# Patient Record
Sex: Female | Born: 1935
Health system: Southern US, Community
[De-identification: ages and names within clinical notes are randomized; demographics above are authoritative.]

## PROBLEM LIST (undated history)

## (undated) DIAGNOSIS — N83209 Unspecified ovarian cyst, unspecified side: Secondary | ICD-10-CM

## (undated) DIAGNOSIS — C4490 Unspecified malignant neoplasm of skin, unspecified: Secondary | ICD-10-CM

## (undated) DIAGNOSIS — R12 Heartburn: Secondary | ICD-10-CM

## (undated) DIAGNOSIS — I2510 Atherosclerotic heart disease of native coronary artery without angina pectoris: Secondary | ICD-10-CM

## (undated) DIAGNOSIS — F419 Anxiety disorder, unspecified: Secondary | ICD-10-CM

## (undated) DIAGNOSIS — I10 Essential (primary) hypertension: Secondary | ICD-10-CM

## (undated) DIAGNOSIS — E785 Hyperlipidemia, unspecified: Secondary | ICD-10-CM

## (undated) DIAGNOSIS — I4891 Unspecified atrial fibrillation: Secondary | ICD-10-CM

## (undated) DIAGNOSIS — M1990 Unspecified osteoarthritis, unspecified site: Secondary | ICD-10-CM

## (undated) HISTORY — PX: NEPHRECTOMY: SHX65

## (undated) HISTORY — DX: Essential (primary) hypertension: I10

## (undated) HISTORY — DX: Hyperlipidemia, unspecified: E78.5

## (undated) HISTORY — DX: Atherosclerotic heart disease of native coronary artery without angina pectoris: I25.10

## (undated) HISTORY — DX: Unspecified ovarian cyst, unspecified side: N83.209

## (undated) HISTORY — DX: Unspecified atrial fibrillation: I48.91

## (undated) HISTORY — PX: TUBAL LIGATION: SHX77

## (undated) HISTORY — PX: SKIN CANCER EXCISION: SHX779

## (undated) HISTORY — DX: Anxiety disorder, unspecified: F41.9

## (undated) HISTORY — DX: Unspecified osteoarthritis, unspecified site: M19.90

## (undated) HISTORY — PX: CHOLECYSTECTOMY: SHX55

## (undated) HISTORY — DX: Heartburn: R12

## (undated) HISTORY — PX: TONSILLECTOMY: SUR1361

## (undated) HISTORY — DX: Unspecified malignant neoplasm of skin, unspecified: C44.90

---

## 1984-03-03 HISTORY — PX: CORONARY ARTERY BYPASS GRAFT: SHX141

## 1998-03-06 ENCOUNTER — Encounter: Payer: Self-pay | Admitting: Emergency Medicine

## 1998-03-06 ENCOUNTER — Inpatient Hospital Stay (HOSPITAL_COMMUNITY): Admission: EM | Admit: 1998-03-06 | Discharge: 1998-03-09 | Payer: Self-pay | Admitting: Emergency Medicine

## 1998-05-15 ENCOUNTER — Emergency Department (HOSPITAL_COMMUNITY): Admission: EM | Admit: 1998-05-15 | Discharge: 1998-05-15 | Payer: Self-pay | Admitting: *Deleted

## 1998-05-15 ENCOUNTER — Encounter: Payer: Self-pay | Admitting: *Deleted

## 1999-12-09 ENCOUNTER — Encounter: Admission: RE | Admit: 1999-12-09 | Discharge: 1999-12-09 | Payer: Self-pay | Admitting: Internal Medicine

## 1999-12-09 ENCOUNTER — Encounter (HOSPITAL_BASED_OUTPATIENT_CLINIC_OR_DEPARTMENT_OTHER): Payer: Self-pay | Admitting: Internal Medicine

## 2002-09-29 ENCOUNTER — Encounter: Admission: RE | Admit: 2002-09-29 | Discharge: 2002-09-29 | Payer: Self-pay | Admitting: Internal Medicine

## 2002-09-29 ENCOUNTER — Encounter (HOSPITAL_BASED_OUTPATIENT_CLINIC_OR_DEPARTMENT_OTHER): Payer: Self-pay | Admitting: Internal Medicine

## 2002-10-02 HISTORY — PX: HYSTEROSCOPY: SHX211

## 2002-11-01 ENCOUNTER — Ambulatory Visit (HOSPITAL_COMMUNITY): Admission: RE | Admit: 2002-11-01 | Discharge: 2002-11-01 | Payer: Self-pay | Admitting: Obstetrics and Gynecology

## 2002-11-01 ENCOUNTER — Encounter (INDEPENDENT_AMBULATORY_CARE_PROVIDER_SITE_OTHER): Payer: Self-pay | Admitting: Specialist

## 2003-04-10 ENCOUNTER — Encounter: Admission: RE | Admit: 2003-04-10 | Discharge: 2003-04-10 | Payer: Self-pay | Admitting: Internal Medicine

## 2004-06-24 ENCOUNTER — Other Ambulatory Visit: Admission: RE | Admit: 2004-06-24 | Discharge: 2004-06-24 | Payer: Self-pay | Admitting: *Deleted

## 2004-07-16 ENCOUNTER — Encounter: Admission: RE | Admit: 2004-07-16 | Discharge: 2004-07-16 | Payer: Self-pay | Admitting: *Deleted

## 2005-01-20 ENCOUNTER — Encounter: Admission: RE | Admit: 2005-01-20 | Discharge: 2005-01-20 | Payer: Self-pay | Admitting: Internal Medicine

## 2005-02-25 ENCOUNTER — Encounter: Admission: RE | Admit: 2005-02-25 | Discharge: 2005-02-25 | Payer: Self-pay | Admitting: Internal Medicine

## 2005-03-11 ENCOUNTER — Encounter: Admission: RE | Admit: 2005-03-11 | Discharge: 2005-03-11 | Payer: Self-pay | Admitting: Internal Medicine

## 2005-11-20 ENCOUNTER — Encounter: Admission: RE | Admit: 2005-11-20 | Discharge: 2005-11-20 | Payer: Self-pay | Admitting: Internal Medicine

## 2006-01-29 ENCOUNTER — Inpatient Hospital Stay (HOSPITAL_COMMUNITY): Admission: AD | Admit: 2006-01-29 | Discharge: 2006-02-02 | Payer: Self-pay | Admitting: Internal Medicine

## 2006-02-19 ENCOUNTER — Encounter (HOSPITAL_COMMUNITY): Admission: RE | Admit: 2006-02-19 | Discharge: 2006-05-20 | Payer: Self-pay | Admitting: Cardiology

## 2006-05-21 ENCOUNTER — Encounter (HOSPITAL_COMMUNITY): Admission: RE | Admit: 2006-05-21 | Discharge: 2006-05-29 | Payer: Self-pay | Admitting: Cardiology

## 2006-06-23 ENCOUNTER — Encounter: Admission: RE | Admit: 2006-06-23 | Discharge: 2006-06-23 | Payer: Self-pay | Admitting: Internal Medicine

## 2006-11-12 ENCOUNTER — Other Ambulatory Visit: Admission: RE | Admit: 2006-11-12 | Discharge: 2006-11-12 | Payer: Self-pay | Admitting: Obstetrics & Gynecology

## 2006-12-08 ENCOUNTER — Encounter: Admission: RE | Admit: 2006-12-08 | Discharge: 2006-12-08 | Payer: Self-pay | Admitting: Obstetrics & Gynecology

## 2006-12-08 LAB — HM MAMMOGRAPHY: HM Mammogram: NORMAL

## 2007-02-24 ENCOUNTER — Encounter: Admission: RE | Admit: 2007-02-24 | Discharge: 2007-02-24 | Payer: Self-pay | Admitting: Internal Medicine

## 2007-04-15 ENCOUNTER — Inpatient Hospital Stay (HOSPITAL_COMMUNITY): Admission: EM | Admit: 2007-04-15 | Discharge: 2007-04-18 | Payer: Self-pay | Admitting: Emergency Medicine

## 2007-04-16 HISTORY — PX: CARDIAC CATHETERIZATION: SHX172

## 2007-07-08 ENCOUNTER — Encounter: Admission: RE | Admit: 2007-07-08 | Discharge: 2007-07-08 | Payer: Self-pay | Admitting: Internal Medicine

## 2007-10-21 ENCOUNTER — Encounter: Admission: RE | Admit: 2007-10-21 | Discharge: 2007-10-21 | Payer: Self-pay | Admitting: Internal Medicine

## 2007-11-15 ENCOUNTER — Other Ambulatory Visit: Admission: RE | Admit: 2007-11-15 | Discharge: 2007-11-15 | Payer: Self-pay | Admitting: Obstetrics and Gynecology

## 2007-11-15 LAB — HM PAP SMEAR

## 2008-01-06 ENCOUNTER — Encounter: Admission: RE | Admit: 2008-01-06 | Discharge: 2008-01-06 | Payer: Self-pay | Admitting: Internal Medicine

## 2008-03-31 ENCOUNTER — Encounter: Admission: RE | Admit: 2008-03-31 | Discharge: 2008-03-31 | Payer: Self-pay | Admitting: Internal Medicine

## 2008-07-26 ENCOUNTER — Encounter: Admission: RE | Admit: 2008-07-26 | Discharge: 2008-07-26 | Payer: Self-pay | Admitting: Internal Medicine

## 2009-01-05 ENCOUNTER — Encounter: Admission: RE | Admit: 2009-01-05 | Discharge: 2009-01-05 | Payer: Self-pay | Admitting: Internal Medicine

## 2009-05-11 ENCOUNTER — Encounter: Admission: RE | Admit: 2009-05-11 | Discharge: 2009-05-11 | Payer: Self-pay | Admitting: Internal Medicine

## 2009-07-04 ENCOUNTER — Encounter: Admission: RE | Admit: 2009-07-04 | Discharge: 2009-07-04 | Payer: Self-pay | Admitting: Internal Medicine

## 2009-07-16 ENCOUNTER — Encounter: Admission: RE | Admit: 2009-07-16 | Discharge: 2009-07-16 | Payer: Self-pay | Admitting: Internal Medicine

## 2009-11-15 ENCOUNTER — Ambulatory Visit: Payer: Self-pay | Admitting: Cardiology

## 2009-11-21 ENCOUNTER — Encounter: Admission: RE | Admit: 2009-11-21 | Discharge: 2009-11-21 | Payer: Self-pay | Admitting: Internal Medicine

## 2010-02-14 ENCOUNTER — Encounter
Admission: RE | Admit: 2010-02-14 | Discharge: 2010-02-14 | Payer: Self-pay | Source: Home / Self Care | Attending: Internal Medicine | Admitting: Internal Medicine

## 2010-02-28 ENCOUNTER — Encounter
Admission: RE | Admit: 2010-02-28 | Discharge: 2010-02-28 | Payer: Self-pay | Source: Home / Self Care | Attending: Internal Medicine | Admitting: Internal Medicine

## 2010-03-23 ENCOUNTER — Encounter (HOSPITAL_BASED_OUTPATIENT_CLINIC_OR_DEPARTMENT_OTHER): Payer: Self-pay | Admitting: Internal Medicine

## 2010-05-16 ENCOUNTER — Ambulatory Visit: Payer: Self-pay | Admitting: Cardiology

## 2010-06-06 ENCOUNTER — Ambulatory Visit: Payer: Self-pay | Admitting: Cardiology

## 2010-06-24 ENCOUNTER — Ambulatory Visit: Payer: Self-pay | Admitting: Cardiology

## 2010-06-26 ENCOUNTER — Other Ambulatory Visit (HOSPITAL_BASED_OUTPATIENT_CLINIC_OR_DEPARTMENT_OTHER): Payer: Self-pay | Admitting: Internal Medicine

## 2010-06-26 DIAGNOSIS — M549 Dorsalgia, unspecified: Secondary | ICD-10-CM

## 2010-07-01 ENCOUNTER — Other Ambulatory Visit: Payer: Self-pay | Admitting: *Deleted

## 2010-07-01 DIAGNOSIS — E7800 Pure hypercholesterolemia, unspecified: Secondary | ICD-10-CM

## 2010-07-02 ENCOUNTER — Ambulatory Visit: Payer: Self-pay | Admitting: Cardiology

## 2010-07-05 ENCOUNTER — Ambulatory Visit
Admission: RE | Admit: 2010-07-05 | Discharge: 2010-07-05 | Disposition: A | Discharge status: Home / Self Care | Payer: Medicare Other | Source: Ambulatory Visit | Attending: Internal Medicine | Admitting: Internal Medicine

## 2010-07-05 ENCOUNTER — Other Ambulatory Visit (HOSPITAL_BASED_OUTPATIENT_CLINIC_OR_DEPARTMENT_OTHER): Payer: Self-pay | Admitting: Internal Medicine

## 2010-07-05 DIAGNOSIS — M549 Dorsalgia, unspecified: Secondary | ICD-10-CM

## 2010-07-16 NOTE — H&P (Signed)
NAMELYLLA, EIFLER NO.:  [id number]   MEDICAL RECORD NO.:  [id number]          PATIENT TYPE:  INP   LOCATION:  2913                         FACILITY:  [facility]   PHYSICIAN:  Peter M. Swaziland, M.D.  DATE OF BIRTH:  15-Oct-1934   DATE OF ADMISSION:  04/15/2007  DATE OF DISCHARGE:                              HISTORY & PHYSICAL   HISTORY OF PRESENT ILLNESS:  Ms. Rawling is a pleasant 76 year old white  female who was seen as a work-in today for evaluation of chest pain.  She has a known history of coronary disease is status post coronary  bypass surgery x3 in 1986.  Her last evaluation in November 2007 when  she presented with unstable angina.  Cardiac catheterization at that  time demonstrated severe three-vessel obstructive coronary disease.  She  had a 40% narrowing in the distal left main coronary.  The LAD was  severely diffusely diseased up to 90% throughout the proximal and mid  vessel.  The first diagonal branch was relatively small and had a 90%  stenosis in the mid vessel.  There  was moderate-sized intermediate  vessel which had a 95% ostial stenosis, and there was an 80-90% stenosis  in the proximal intermediate.  The left circumflex coronary and 40-50%  disease in the mid vessel.  The right coronary was dominant, had a 60-  70% stenosis in the proximal and mid vessel and a 70% stenosis in the  PDA.  The saphenous vein graft to the posterolateral branch was  occluded.  Saphenous vein graft to the intermediate branch was occluded.  The LIMA graft to the LAD was patent with good runoff.  Left ventricular  function was normal with ejection fraction of 60%.  There were no  targets that were amenable to catheter-based intervention at that time.  The patient was seen consultation by Dr. Andrey Spearman, who felt  that the left coronary graft targets were very poor and recommended  continued medical therapy.  The patient has been treated medically and  has done  quite well with occasional anginal symptoms.  However,  yesterday she began experiencing some mild substernal chest discomfort.  This morning when she got up to fix her breakfast, she developed more  severe chest pain.  If she would lie down and stretch out, the pain  would get better.  She came to our office this afternoon.  On walking  into the office, she developed severe substernal chest pain associated  with weakness and clamminess.  She was given three sublingual  nitroglycerin with improvement in her pain.  Her ECG shows a sinus  tachycardia with ST depression consistent with inferolateral ischemia.  Given her unstable angina, the patient is now admitted for aggressive  medical therapy and consideration of alternative treatments.   PAST MEDICAL HISTORY:  1. Hypertension.  2. Hyperlipidemia.  3. History of basal cell and squamous cell skin cancers.  4. History of anxiety.  5. History of lumbar degenerative disk disease.  6. Coronary artery disease as noted above.   She is allergic to SULFA and DEMEROL.   Additional surgeries  include prior left nephrectomy in 1963 for  hypertension.  She has had bilateral tubal ligations.  She has had  previous cholecystectomy.   CURRENT MEDICATIONS:  1. Zocor 20 mg per day.  2. Aspirin 325 mg daily.  3. Imdur 60 mg per day.  4. Prilosec 20 mg per day.  5. Metoprolol ER 100 mg daily.  6. Plavix 75 mg per day.  7. Norvasc 5 mg per day.  8. Benicar 40 mg per day.  9. Caltrate 600 mg daily.  10.Multivitamin daily.  11.Claritin 10 mg daily.  12.Prempro 45/65 mg daily.   SOCIAL HISTORY:  The patient is married.  She has worked for [employer]  [place].  She denies a history of tobacco or alcohol use.  She has one  son.   FAMILY HISTORY:  Father died at age 63 of myocardial infarction.  Mother  died in her 53s with Alzheimer disease.  One brother has had coronary  artery bypass surgery in his 76s.  One sister has breast cancer.  Other   family history is negative.   REVIEW OF SYSTEMS:  The patient denies any recent weight gain, edema,  orthopnea or PND.  She has no history of TIA or stroke.  She does have  claustrophobia.  She has had no recent change in bowel or bladder  habits.  Other review of systems is negative.   PHYSICAL EXAMINATION:  The patient is a pleasant white female who  appears in distress and clammy.  Her blood pressure is 142/90, pulse is 120 and regular.  HEENT:  She is normocephalic, atraumatic.  She wears glasses.  Pupils  are equal, round, reactive to light accommodation.  Extraocular  movements are full.  Oropharynx is clear.  NECK:  Supple without JVD, adenopathy, thyromegaly or bruits.  LUNGS:  Clear to auscultation and percussion.  CARDIAC:  Reveals regular rate and rhythm without gallop, murmur, rub or  click.  ABDOMEN:  Soft, nontender, without masses or hepatosplenomegaly.  She  has old surgical scars.  Femoral and pedal pulses 2+ and symmetric.  She has no edema.  NEUROLOGIC:  Nonfocal.   LABORATORY DATA:  ECG shows sinus tachycardia with ST-T wave abnormality  consistent with inferolateral ischemia.   IMPRESSION:  1. Unstable angina pectoris.  2. Coronary artery disease, status post coronary artery bypass      grafting in 1986 with documented occlusion of vein grafts to the      posterolateral branch and to the intermediate branch.  3. Hypertension.  4. Hypercholesterolemia.   PLAN:  Proceed with emergent admission.  Will stabilize with IV  nitroglycerin, IV Lopressor, subcu Lovenox and IV Integrilin.  We will  obtain serial cardiac enzymes to rule out myocardial infarction.  We  will plan cardiac catheterization tomorrow.           ______________________________  Peter M. Swaziland, M.D.     PMJ/MEDQ  D:  04/15/2007  T:  04/17/2007  Job:  [id number]   cc:   Barry Dienes. Eloise Harman, M.D.

## 2010-07-16 NOTE — Cardiovascular Report (Signed)
NAMECIERA, Marilyn NO.:  [id number]   MEDICAL RECORD NO.:  [id number]          PATIENT TYPE:  INP   LOCATION:  2913                         FACILITY:  [facility]   PHYSICIAN:  Peter M. Swaziland, M.D.  DATE OF BIRTH:  10-09-1934   DATE OF PROCEDURE:  04/16/2007  DATE OF DISCHARGE:                            CARDIAC CATHETERIZATION   INDICATIONS FOR PROCEDURE:  A 76 year old white female who presents with  unstable angina pectoris.  She has known history of coronary disease and  is status post coronary bypass surgery in 1986.  Cardiac catheterization  November 2007 documented occlusion of the vein grafts to the right  coronary artery and to the intermediate branch.  She was considered for  surgery at that time and felt to be a poor candidate due to poor targets  and due to the fact that her right coronary did not have severe disease.   PROCEDURE:  Left heart catheterization, left ventricular angiography,  saphenous vein graft angiography and LIMA graft angiography.   ACCESS:  Via right femoral artery using the standard Seldinger  technique.   EQUIPMENT:  A 6-French 4-cm right and left Judkins catheter, 6-French  pigtail catheter, 6-French arterial sheath.   MEDICATIONS:  Local anesthesia with 1% Xylocaine, Versed 2 mg IV.   CONTRAST:  100 mL of Omnipaque.   HEMODYNAMIC DATA:  Aortic pressure is 156/65 with a mean of 103.  Left  ventricle pressure is 148 with an EDP of 19.   ANGIOGRAPHIC DATA:  The left coronary arises and distributes normally.  The left main coronary is without significant disease.   Left anterior descending artery has 90% segmental stenosis in the  proximal vessel.  It is occluded in the midvessel after the first  diagonal.  The first diagonal branch has a 90% stenosis proximally and  then again in the midvessel.   There is a moderate-sized intermediate vessel which has a 95% ostial  stenosis.  This is followed by 80-90% segmental stenosis  in the proximal  vessel.  There is some retrograde filling of an old vein graft stump.   Left circumflex coronary is a small vessel, continues in the AV groove.  It has 40% disease in the midvessel.   The right coronary is a large dominant vessel.  It is diffusely and  heavily calcified.  He has a 60% eccentric stenosis in the proximal  vessel with diffuse disease in the proximal to mid vessel.  The PDA has  an 80-90% stenosis proximally.  The first posterolateral branch is  without significant disease.  The continuation of the posterolateral  branch is occluded.   Saphenous vein graft to the posterolateral branch of the right coronary  is occluded.   We did not cannulate this vein graft to the intermediate branch, but  this was noted to be occluded from prior cardiac catheterizations, and  the occluded stump can be seen filling retrograde.   The LIMA graft is widely patent with excellent runoff.   Left ventricular angiography was performed in the RAO view.  This  demonstrates normal left ventricular size and contractility with  normal  systolic function.  Ejection fraction is estimated at 60%.  There is  mild to moderate mitral insufficiency.   IMPRESSION:  1. Severe three-vessel obstructive coronary artery disease.  2. Occluded saphenous vein graft to the intermediate vessel.  3. Occluded saphenous vein graft to the posterolateral branch of the      right coronary.  4. Patent left internal mammary artery graft to left anterior      descending artery.  5. Normal left ventricular function.   PLAN:  Compared to her prior study in November 2007, there has been no  significant change, and I would recommend continued aggressive medical  therapy and consider a workup of other causes of her chest pain.           ______________________________  Peter M. Swaziland, M.D.     PMJ/MEDQ  D:  04/16/2007  T:  04/19/2007  Job:  [id number]   cc:   Barry Dienes. Eloise Harman, M.D.

## 2010-07-16 NOTE — Discharge Summary (Signed)
Marilyn, Ellis NO.:  [id number]   MEDICAL RECORD NO.:  [id number]          PATIENT TYPE:  INP   LOCATION:  2010                         FACILITY:  [facility]   PHYSICIAN:  Peter M. Swaziland, M.D.  DATE OF BIRTH:  04-Sep-1934   DATE OF ADMISSION:  04/15/2007  DATE OF DISCHARGE:  04/18/2007                               DISCHARGE SUMMARY   HISTORY OF PRESENT ILLNESS:  [name] is a 76 year old white female  with known history of coronary artery disease.  She is status post CABG  x3 in 1986.  Last cardiac catheterization November 2007 showed occlusion  of vein graft to the posterolateral branch of the right coronary, and  occlusion of the vein graft to the intermediate branch.  The right  coronary otherwise had no significant obstructive disease.  The patient  was evaluated by cardiothoracic surgery at that time, and felt not to be  a good candidate for redo bypass surgery, since the targets of her left  coronary were poor.  She has done well on medical therapy since that  time, until she presented at this time with acute, severe substernal  chest pain radiating up into her jaw.  She was noted to be tachycardic.  An ECG showed somewhat diffuse ST depression, consistent with ischemia.  She was admitted for further evaluation.  For details of her past  medical history, social history, family history and physical exam,  please see admission history and physical.   LABORATORY DATA:  Initial ECG shows sinus tachycardia with rate of 121.  No ST or T-wave changes consistent with inferolateral ischemia.  Chest X-  Ray:  Showed no active disease.  Hemoglobin 14.5, hematocrit 41.3, white  count 10,200, platelets 171,000.  Sodium 137, potassium 3.6, chloride  101, CO2 25, BUN 8, creatinine 0.7, glucose of 132.  Coags were normal.  Liver function studies were normal.  Serial cardiac enzymes were normal.  Cholesterol 165, LDL 91, HDL 31, triglycerides 213.  Electrolytes were  normal.  Glycosylated hemoglobin was 5.6%.   HOSPITAL COURSE:  The patient was admitted to [facility].  She was begun on  subcutaneous Lovenox, IV heparin and IV nitroglycerin.  She was also  loaded with IV Lopressor.  With this therapy, her chest pain resolved.  Her heart rate came down.  Her blood pressure remained stable.  Her ECG  returned to normal.  As noted, she ruled out myocardial infarction and  her blood pressure remained stable.  She was started on Niaspan 500 mg  q.h.s. for her dyslipidemia, in addition to her Zocor.   On April 16, 2007 she underwent a cardiac catheterization.  This  demonstrated a 90% proximal LAD stenosis, followed by total occlusion  after the first diagonal branch.  The first diagonal branch had a 90%  stenosis proximally and 95% stenosis in the midvessel.  The intermediate  branch had a 95% ostial stenosis, followed by 80-90% stenosis  proximally.  The left circumflex coronary was small caliber and had  nonobstructive disease up to 40%.  The right coronary was heavily  calcified throughout.  There was  50-60% disease proximally. and 80-90%  stenosis in the PDA.  The vein graft to the posterolateral branch of the  right coronary remained occluded, as did the vein graft to the  intermediate.  The LIMA graft to the LAD was widely patent with good  runoff.  Left ventricular function was normal, with ejection fraction of  60%.  There was mild to moderate mitral insufficiency and mild aortic  root enlargement.  This was compared with previous catheterization in  November 2007.  There was no significant change.  Therefore, the cause  of her acute chest pain was really unclear.  We stopped her IV  nitroglycerin at this point.  She was started back on oral nitrates.  Her Integrilin and Lovenox were held.  Her oxygen was discontinued and  she was transferred to telemetry and progressively ambulated.  On  April 17, 2007 she did undergo a CT of her chest with  contrast, which  showed no evidence of pulmonary embolus or aortic dissection.  Her  hemoglobin remained stable.  Her electrolytes remained stable.  She was  discharged home in stable condition on April 18, 2007.   DISCHARGE DIAGNOSES:  1. Unstable angina pectoris.  2. Coronary artery disease, status post coronary artery bypass      grafting; with known occlusion of the vein grafts to the      intermediate vessel, and to the posterolateral branch of the right      coronary artery.  3. Dyslipidemia, combined.  4. Hypertension.   DISCHARGE MEDICATIONS:  1. Aspirin 325 mg per day.  2. Prempro 0.45/1.5 mg daily.  3. Zocor 20 mg per day.  4. Toprol XL 100 mg per day.  5. Plavix 75 mg daily.  6. Norvasc 5 mg daily.  7. Benicar 40 mg per day.  8. Caltrate 600 plus D daily.  9. Centrum Silver multivitamin daily.  10.Claritin daily.  11.Prilosec 20 mg per day.  12.Isosorbide 60 mg per day.  13.Niaspan 500 mg at bedtime.  14.Nitroglycerin sublingual p.r.n.   The patient will remain on a heart-healthy diet.  She will gradually  increase her activity.  She will follow up with Dr. Swaziland in 2 weeks.   DISCHARGE STATUS:  Improved.           ______________________________  Peter M. Swaziland, M.D.     PMJ/MEDQ  D:  04/18/2007  T:  04/19/2007  Job:  [id number]   cc:   Barry Dienes. Eloise Harman, M.D.

## 2010-07-18 ENCOUNTER — Encounter: Payer: Self-pay | Admitting: Cardiology

## 2010-07-18 DIAGNOSIS — M1990 Unspecified osteoarthritis, unspecified site: Secondary | ICD-10-CM | POA: Insufficient documentation

## 2010-07-18 DIAGNOSIS — C4490 Unspecified malignant neoplasm of skin, unspecified: Secondary | ICD-10-CM | POA: Insufficient documentation

## 2010-07-18 DIAGNOSIS — E785 Hyperlipidemia, unspecified: Secondary | ICD-10-CM | POA: Insufficient documentation

## 2010-07-18 DIAGNOSIS — F419 Anxiety disorder, unspecified: Secondary | ICD-10-CM | POA: Insufficient documentation

## 2010-07-18 DIAGNOSIS — I10 Essential (primary) hypertension: Secondary | ICD-10-CM | POA: Insufficient documentation

## 2010-07-18 DIAGNOSIS — R12 Heartburn: Secondary | ICD-10-CM | POA: Insufficient documentation

## 2010-07-18 DIAGNOSIS — I2510 Atherosclerotic heart disease of native coronary artery without angina pectoris: Secondary | ICD-10-CM | POA: Insufficient documentation

## 2010-07-18 DIAGNOSIS — H9190 Unspecified hearing loss, unspecified ear: Secondary | ICD-10-CM | POA: Insufficient documentation

## 2010-07-19 ENCOUNTER — Other Ambulatory Visit: Payer: Self-pay | Admitting: *Deleted

## 2010-07-19 ENCOUNTER — Ambulatory Visit: Payer: Self-pay | Admitting: Cardiology

## 2010-07-19 ENCOUNTER — Encounter: Payer: Self-pay | Admitting: Cardiology

## 2010-07-19 NOTE — Consult Note (Signed)
NAMETAMSIN, NADER NO.:  [id number]   MEDICAL RECORD NO.:  [id number]          PATIENT TYPE:  INP   LOCATION:  3715                         FACILITY:  [facility]   PHYSICIAN:  Georga Hacking, M.D.DATE OF BIRTH:  Sep 05, 1934   DATE OF CONSULTATION:  01/29/2006  DATE OF DISCHARGE:                                 CONSULTATION   I was asked to see this very nice 76 year old by Dr. Eloise Harman for  evaluation of chest pain and unstable angina.  The patient has a history  of coronary artery disease and has a previous history of bypass grafting  reportedly x3 in 1986.  The grafts are unknown, but according to  records, she had a follow up catheterization in 2000 that showed a  patent internal mammary graft to the LAD with occlusion of the vein  graft to the first diagonal branch.  Another graft was not mentioned.  She has had a previous partially reversible bilateral defect noted in  the past thought due to the diagonal disease.  She has had chronic  angina which has been worse with breathing cold air in for several years  but had been stable.  She was on vacation and while away, two nights  while watching television, she developed severe mid sternal chest  discomfort of moderate intensity with radiation to the neck and the left  arm lasting for several hours resolving with going to sleep.  She had  some mild shaking associated with this.  She did not have nitroglycerin  tablets with her and returned to [facility] today where she saw Dr.  Eloise Harman.  An EKG was normal and she was admitted to the hospital to  rule out a myocardial infarction.  She subsequently had an abnormal  cardiac MB of 8.8 and a positive troponin of greater than 1.  She is not  currently having any chest discomfort.   PAST MEDICAL HISTORY:  Remarkable for hypertension, hyperlipidemia,  previous history of basal cell and squamous cell carcinomas, anxiety,  mild claustrophobia, and a previous history of  lumbar spine disease.   PAST SURGICAL HISTORY:  She has had a previous cholecystectomy, left  nephrectomy due to hypertension in 1963, remote bilateral tubal  ligation, coronary artery bypass grafting x3 reportedly, and previous  cataract surgery.  She evidently had an appendectomy at the time of her  gallbladder surgery.   ALLERGIES:  DEMEROL AND SULFA.   MEDICATIONS:  Recorded by Dr. Eloise Harman include Toprol XL 100 mg daily,  Prilosec 20 mg daily, Claritin D, Benicar 40 mg 1/2 tablet daily,  Prempro 2.5/0.625 mg daily, Norvasc 5 mg daily, Ecotrin 325 mg daily,  Zocor 40 mg daily, Niacin 500 mg b.i.d.   SOCIAL HISTORY:  She has one son.  She has been married for 40 years and  has previously worked at [employer] [place].  She has no history of smoking  or alcohol abuse and is happily married.   FAMILY HISTORY:  As recorded in Dr. Silvano Rusk note is reviewed and I  have nothing to add to it.   REVIEW OF SYMPTOMS:  She  has had a 6 pound weight gain since September.  She has not had recent fever or chills.  She has a history of basal cell  and squamous cell carcinomas.  She has had a previous history of  cataract extraction.  She has no glaucoma or macular degeneration.  She  has had a history of hiatal hernia and episodic dyspepsia.  She also  complains of occasional difficulty swallowing.  She has had some anxiety  previously and some mild claustrophobia.  She has mild headaches in the  morning.  She has a previous history of significant lumbar disc disease  with previous epidural spinal injections.  Other than as noted above,  the remainder of the review of systems is unremarkable.   PHYSICAL EXAMINATION:  GENERAL:  She is a pleasant, mildly anxious female appearing older than  her stated age of 40.  VITAL SIGNS:  Blood pressure 151/86, pulse was 78.  SKIN:  Warm and dry.  There is a large hemangioma across the back that  is congenital.  HEENT:  EOMI, PERRLA, CNS clear, fundi not  examined.  Pharynx negative.  NECK:  Supple without masses, JVD, thyromegaly, or bruits.  LUNGS:  Clear to A&P.  CARDIOVASCULAR:  Normal S1 and S2, no S3 or murmur.  ABDOMEN:  Soft and nontender.  EXTREMITIES:  Femoral pulses are 2+.  There is a well healed saphenous  harvest scar running the length of the right leg.  NEUROLOGICAL:  Normal.   EKG is normal.   LABORATORY DATA:  Normal white count, hemoglobin 13.4, hematocrit 38,  MCV 101, platelet count 115,000 which is slightly low.  Chemistry panel  shows a sodium 138, potassium 3.5, glucose 121.  SGOT 50.  Her CPK total  is 416 with an MB of 8.8, troponin I 1.83 which is elevated.   IMPRESSION:  1. Chest discomfort consistent with acute coronary syndrome and      probable recent myocardial infarction as evidenced by increased      troponin.  2. Coronary artery disease with previous bypass grafting, occlusion of      the graft to the diagonal, unknown grafts elsewhere.  3. Hypertension.  4. Hyperlipidemia under treatment.  5. History of hiatal hernia.  6. Mild thrombocytopenia.  7. Elevation of glucoses with previous diagnosis of diabetes.   RECOMMENDATIONS:  She was placed on heparin and she can continue on  aspirin and beta blockers.  If she has recurrent symptoms, initiate  Plavix.  I believe that if she has recurrent symptoms, she should be  placed on a 2B3 inhibitor and likely will need a repeat catheterization.  We will keep her n.p.o. and ask for Dr. Swaziland to see her in the  morning.  We discussed catheterization with her fully and she is  agreeable to proceed.      Georga Hacking, M.D.  Electronically Signed     WST/MEDQ  D:  01/29/2006  T:  01/29/2006  Job:  [id number]   cc:   Peter M. Swaziland, M.D.  Barry Dienes Eloise Harman, M.D.

## 2010-07-19 NOTE — Op Note (Signed)
NAME:  [name], [name]                        ACCOUNT NO.:  [id number]   MEDICAL RECORD NO.:  [id number]                   PATIENT TYPE:  AMB   LOCATION:  SDC                                  FACILITY:  [facility]   PHYSICIAN:  Laqueta Linden, M.D.                 DATE OF BIRTH:  03/04/1934   DATE OF PROCEDURE:  11/01/2002  DATE OF DISCHARGE:                                 OPERATIVE REPORT   PREOPERATIVE DIAGNOSIS:  Postmenopausal bleeding, due to endometrial polyps.   POSTOPERATIVE DIAGNOSIS:  Postmenopausal bleeding due to endometrial polyps.   PROCEDURE:  Hysteroscopic resection.   SURGEON:  Laqueta Linden, M.D.   ANESTHESIA:  General LMA.   ESTIMATED BLOOD LOSS:  Negligible.   FLUIDS:  Sorbitol intake 20 cc.   COMPLICATIONS:  None.   INDICATIONS:  [name] is a 76 year old gravida 1, para 1 menopausal  female, who presented with postmenopausal bleeding.  Pelvic ultrasound with  sonohistogram confirmed the presence of two endometrial polyps, with an  otherwise thin atrophic-appearing endometrial cavity.  The patient is to  undergo outpatient hysteroscopic resection.  She has seen the informed  consent film, voiced her understanding and acceptance of all risks,  benefits, alternatives and complications, including but not limited to:  Anesthesia risks (which are increased due to her medical problems), risks of  infection or bleeding, risks of uterine perforation, risks of bowel injury  as well as recurrent polyps, excessive bleeding postoperatively.  She has  seen the informed consent film, as has her husband; they voiced their  understanding and acceptance of all risks and agree to proceed.   DESCRIPTION OF PROCEDURE:  The patient was taken to the operating room after  proper identification and consent was ascertained.  She was placed on the  operating room table in the supine position.  After a general LMA was  introduced, she was placed in the [place] stirrups and the  perineum and vagina  were prepped and draped in routine sterile fashion.  A transurethral Foley  was placed; this was removed at the conclusion of the procedure.  Bimanual  examination confirmed an anterior mobile uterus.   A speculum was placed in the vagina, and the anterior lip of the cervix  grasped with a single-toothed tenaculum.  The endometrial cavity sounded to  8 cm.  The internal os was gently dilated to a 33-Pratt dilator.  The  resectoscope with single loop was then inserted under direct vision, with  continuous Sorbitol infusion.  The endocervical canal was free of lesions.  There were three separate  polyps noted.  Both tubal ostia were visualized.  The remainder of the endometrial cavity was completely atrophic in  appearance, with no other lesions.  The three isolated polyps were resected  and all tissue pieces were removed.  Hemostasis was excellent.  The scope  was then withdrawn.   Net Sorbitol intake was  20 cc.  The estimated blood loss was negligible.  The tenaculum was removed and the tenaculum site was hemostatic.  The  patient was stable and extubated on transfer to the PACU.  She will be  observed and discharged per anesthesia protocol.  She is to follow up in the  office in two to three weeks time, or to call sooner for excessive pain,  fever, bleeding or other concerns.  She is to take Advil or Tylenol as  needed for any cramping, and continue her routine medications.  She is given  routine verbal and written discharge instructions.  She is to call the  office for any problems.                                               Laqueta Linden, M.D.    LKS/MEDQ  D:  11/01/2002  T:  11/01/2002  Job:  [id number]

## 2010-07-19 NOTE — Cardiovascular Report (Signed)
[name], [name] NO.:  [id number]   MEDICAL RECORD NO.:  [id number]          PATIENT TYPE:  INP   LOCATION:  3715                         FACILITY:  [facility]   PHYSICIAN:  [name], M.D.  DATE OF BIRTH:  07-06-1934   DATE OF PROCEDURE:  01/30/2006  DATE OF DISCHARGE:                            CARDIAC CATHETERIZATION   INDICATIONS FOR PROCEDURE:  76 year old white female presents with non-Q-  wave myocardial infarction.  She is status post coronary bypass surgery  x3 in 1987.  She had documented occlusion of the vein graft to the  intermediate vessel in 2000.   PROCEDURE:  Left heart catheterization, coronary and left ventricular  angiography, saphenous vein graft angiography, LIMA graft angiography.   ACCESS:  The right femoral artery using standard Seldinger technique.   EQUIPMENT:  6 French 4 cm right and left Judkins catheter, 6 French  pigtail catheter, 6 French arterial sheath   MEDICATIONS:  Local anesthesia 1% Xylocaine, Versed 2 mg IV.   CONTRAST:  120 mL of Omnipaque.   HEMODYNAMIC DATA:  Aortic pressure of 159/61 with a mean of 102.  Left  ventricular pressure is 160 with EDP of 26 mmHg.   ANGIOGRAPHIC DATA:  1. The left coronary arises and distributes normally.  2. The left main coronary has 40% narrowing in the distal vessel.  3. The left anterior descending artery is severely and diffusely      diseased up to 90% throughout the proximal and mid vessel.  The      distal vessel is supplied by the LIMA graft.  The first diagonal      branch has a 90% stenosis and the mid vessel is a relatively small      branch.  There is an intermediate vessel which was a moderate size      and has a 95% ostial stenosis.  There is an 80-90% segmental      disease in the proximal vessel.  The mid vessel demonstrates the      stump of an old vein graft.  4. Left circumflex coronary is a small vessel that continues in the AV      groove.  It has 40-50%  disease in the mid vessel.  5. The right coronary is a large dominant vessel.  It is diffusely      calcified.  It has 60-70% eccentric stenoses in the proximal and      mid vessel.  The PDA has a 70% stenosis at its origin.  The first      posterolateral branch is without significant disease.  The second      posterolateral branch is occluded.  6. The saphenous vein graft to the second posterolateral branch is      occluded.  7. The saphenous vein graft to the intermediate branch is occluded.  8. The LIMA graft to the LAD is patent with excellent runoff.  9. Left ventricular angiography was performed in RAO view.  This      demonstrates normal left ventricular size and contractility with      normal systolic function.  Ejection fraction is estimated 60%.  No      segmental wall motion abnormalities were seen.  There is no      significant mitral insufficiency.   FINAL INTERPRETATION:  1. Severe three vessel obstructive coronary disease.  2. Occluded saphenous vein graft to the intermediate vessel and the      saphenous vein graft to the second posterolateral branch.  3. Patent LIMA graft to LAD.  4. Good left ventricular function.   PLAN:  I would consider the patient for redo coronary bypass surgery  with the potential targets being the intermediate branch, PDA and  posterolateral branches of the right coronary, and possibly the small  diagonal branch.  The other alternative would be aggressive medical  therapy.  She is not a candidate for percutaneous intervention.           ______________________________  [name], M.D.     PMJ/MEDQ  D:  01/30/2006  T:  01/31/2006  Job:  [id number]   cc:   [name]. [name], M.D.  [name] [name], M.D.

## 2010-07-19 NOTE — H&P (Signed)
[name], [name]              ACCOUNT NO.:  [id number]   MEDICAL RECORD NO.:  [id number]          PATIENT TYPE:  INP   LOCATION:  3715                         FACILITY:  [facility]   PHYSICIAN:  Barry Dienes. Eloise Harman, M.D.DATE OF BIRTH:  [birth year]   DATE OF ADMISSION:  01/29/2006  DATE OF DISCHARGE:                              HISTORY & PHYSICAL   CHIEF COMPLAINT:  Chest pain.   HISTORY OF PRESENT ILLNESS:  Patient is a 76 year old white female who  presented to the office for evaluation of chest pain.  She had been  doing well, until the past two nights while she was away on vacation [facility].  Both times, while watching TV, she developed shaking  chills associated with mid-substernal chest pain of moderate intensity,  with radiation to both sides of her neck and the left arm.  This symptom  lasted for several hours at a time and resolved with going to sleep.  She also had mild shortness of breath.  Of note, she had taken her  Niacin, prior to her symptoms which is not a danger for her.  She did  not have her nitroglycerin tablets available.  She is somewhat anxious  about her symptoms and presented to our office upon return from [facility].  She has a history of coronary artery disease and is status post  coronary artery bypass graft x3 in 1986.  She had a followup cardiac  catheterization in 2000 that showed a patent LIMA to LAD, with occlusion  of the saphenous vein graft to the first diagonal branch.  She also had  a Cardiolite exercise test done in June 2006 that showed left  ventricular ejection fraction of 68% with fair exercise capacity and the  develop of chest pain, with minimal inferolateral ST segment depression.  Nuclear images showed a partially reversible lateral wall defect.  Subsequently, medications were adjusted.  She has been doing well until  this past weekend.   PAST MEDICAL HISTORY:  Otherwise significant for hypertension,  hyperlipidemia,  non-metastatic basal cell and squamous cell skin  cancers, mild forgetfulness, and mild anxiety and claustrophobia, lumbar  spine degenerative disk disease and had epidural injections at the left  L5/S1 level in September 2007.   MEDICATIONS:  Prior to admission:  1. Niacin 500 mg p.o. b.i.d., short-acting type.  2. Zocor 40 mg daily.  3. Ecotrin 325 mg daily.  4. Norvasc 5 mg daily.  5. Prem-pro 2.5/0.625 once daily.  6. Multivitamin.  7. Nitroglycerin 0.4 mg sublingual p.r.n. chest pain.  8. Benicar 40-mg tablets, one half tab p.o. daily.  9. Vitamin C once daily.  10.Calcium once daily.  11.Toprol XL 100 mg daily.  12.Prilosec 20 mg daily.  13.Claritin D p.r.n.   ALLERGIES:  SULFA DRUGS AND DEMEROL.   PAST SURGICAL HISTORY:  1. 1963 left nephrectomy due to hypertension.  2. Remote bilateral tubal ligation, 1990.  3. Cholecystectomy 1986.  4. Coronary artery bypass graft x3 including a LIMA graft to the LAD.  5. January 2000 cardiac catheterization.   FAMILY HISTORY:  Her father died at  the age of 53 of myocardial  infarction.  Her mother is in her 77s and has Alzheimer's dementia.  A  brother had coronary artery bypass graft surgery in his 48s.  A sister  has hyperlipidemia, hypertension and had breast cancer.  There are no  close family members with diabetes mellitus or colon cancer.   SOCIAL HISTORY:  She has been married for over 40 years.  She had been  working at [employer] [place].  She has no history of tobacco or alcohol  abuse.  She has one son in his 61s who has hyperlipidemia.   REVIEW OF SYSTEMS:  She has had a weight gain of 6 pounds since  November 12, 2005.  She has not had recent fever.  She has no change in  her vision and denies palpitations, productive cough, constipation,  diarrhea, rectal bleeding.  She has mild indigestion symptoms.  Her back  pain is no longer troublesome for her.  She has mild intermittent  anxiety and no depression.  She has  occasional mild headaches in the  morning and has mild to moderate caffeine consumption.   INITIAL PHYSICAL EXAMINATION:  VITAL SIGNS:  Blood pressure 130/88,  pulse 76, respirations 20, temperature 97.7, weight 154 pounds.  GENERAL:  She is a mildly overweight white female who is in no apparent  distress.  HEENT:  Within normal limits.  NECK:  Supple without jugular venous distention or carotid bruits.  CHEST:  Clear to auscultation.  HEART:  Regular rate and rhythm.  S1 and S2 are present without  significant murmur or gallop.  ABDOMEN:  Had normal bowel sounds and no hepatosplenomegaly or  tenderness.  EXTREMITIES:  Were without cyanosis, clubbing or edema, and the pedal  pulses were normal.  NEUROLOGICAL:  Nonfocal.   LABORATORY STUDIES:  EKG in the office today showed the following:  1. Normal sinus rhythm.  2. Nonspecific ST segment abnormalities with very minimal ST segment      depression in the lateral leads.  3. No significant change versus EKG from October 2002.   IMPRESSION/PLAN:  1. Chest pain:  Although her symptoms are somewhat atypical for      coronary artery disease, it would be most likely that she has      unstable angina, given her past medical history and family history      which is extremely positive for coronary disease.  Gastroesophageal      reflux disease seems less likely.  I plan to admit her to the      hospital for IV Heparin with initiation of long-acting nitrate      treatment.  We will have a cardiology consultant see her for the      possibility of repeat cardiac catheterization.  Serial and cardiac      isoenzymes will be drawn for the unlikely possibility of a non-Q-      wave myocardial infarction.  2. Hypertension.  Recently well controlled on her current medical      regimen.  3. Hyperlipidemia.  This has been stable on Niacin and Zocor. 4. We will repeat this fasting lipids as an outpatient, when she is on      her usual diet.  5.  Lumbar spine degenerative disk disease, stable on her current      regimen, having done well after cortisone injection in the lower      lumbar spine.  6. Anxiety:  Mild.  We will add Xanax p.r.n. to  her regimen during the      hospitalization.           ______________________________  Barry Dienes Eloise Harman, M.D.     DGP/MEDQ  D:  01/29/2006  T:  01/29/2006  Job:  [id number]   cc:   Peter M. [place], M.D.

## 2010-07-19 NOTE — Discharge Summary (Signed)
[name], TOUCHTON NO.:  [id number]   MEDICAL RECORD NO.:  [id number]          PATIENT TYPE:  INP   LOCATION:  3715                         FACILITY:  [facility]   PHYSICIAN:  Peter M. [place], M.D.  DATE OF BIRTH:  1935/01/18   DATE OF ADMISSION:  01/29/2006  DATE OF DISCHARGE:  02/02/2006                               DISCHARGE SUMMARY   HISTORY OF PRESENT ILLNESS:  [name] 76 year old white female who was  admitted for unstable angina.  The patient had been vacationing on the  [place] when she developed shaking chills.  She had mid sternal chest pain  of moderate intensity, radiating to both sides of her neck and left arm.  The symptoms lasted for several hours at a time.  She also had some  shortness of breath.  She did not have nitroglycerin at that time.  Because of these symptoms, she presented for further evaluation and,  given the nature of symptoms, she was admitted for further evaluation.  The patient is status post coronary bypass surgery x3 in 1986.  Her last  cardiac catheterization in 2000 showed a patent LIMA graft to the LAD  and patent saphenous vein graft to the second posterolateral branch.  The graft to her intermediate vessel was occluded.  She has been treated  medically since that time. She did have a Cardiolite study done in 2006  which showed a partially reversible lateral wall defect.  For details of  her Past Medical History, Social History, Family History and Physical  exam, please see admission History and Physical.   LABORATORY DATA:  Chest x-ray showed cardiomegaly with basilar scarring  with no active disease.   Her initial ECG showed normal sinus rhythm with no acute ST or T wave  changes.   Laboratory data: White count was 9600, hemoglobin 13.4, hematocrit 38.8,  platelets 115,000. Coags were normal.  Sodium 138, potassium 3.5,  chloride 104, CO2 26, BUN 5, creatinine 0.5, glucose of 121, albumin  3.0, AST 50. Other liver  function studies were normal.  CPK initially  was 416 with 8.8 MB; troponin was 1.83. From that point, her cardiac  enzymes decreased with CPK of 249 with a 5.7 MB and 178 with 4.8 MB.  Troponins drop to 1.53 and then 1.10.  Urinalysis was clear with  specific gravity of 1.006.  There was moderate leukocyte esterase  positivity; white cells and red cells were 3-6 per high-power field.  She had many bacteria.  Subsequent urine culture grew greater than 10-15  E. coli that was resistant to ampicillin and sulfa but since sensitive  to Cipro.   HOSPITAL COURSE:  The patient was admitted to telemetry monitoring.  She  was treated with IV heparin.  She subsequently ruled in for a small non-  Q-wave myocardial infarction.  Serial ECGs showed no acute change.  On  January 30, 2006, the patient underwent cardiac catheterization.  This  demonstrated 40% stenosis in the distal left main coronary artery.  The  left anterior descending artery had diffuse 90% disease in the proximal  to mid vessel.  The distal vessel was supplied by the LIMA graft.  There  was a small first diagonal which had a 90% stenosis in the mid vessel.  The intermediate vessel was moderate in length but very small in caliber  and had a 95% ostial stenosis and a 90% stenosis proximally.  The left  circumflex coronary artery was small and essentially just traversed in  the AV groove.  The right coronary artery was a dominant vessel and was  severely calcified.  There was 60-70% stenosis in the proximal and mid  vessel and 70% stenosis in the ostial PDA.  The second posterolateral  branch was occluded.  The saphenous vein graft to the intermediate was  again occluded. The saphenous vein graft to the second posterolateral  branch was occluded.  The LIMA graft to LAD was patent.  Left  ventricular function appeared normal with ejection fraction of 60%.   Based on her angiographic results, it was felt the patient was not a   candidate for percutaneous intervention.  It was felt that her non-Q-  wave MI was related to occlusion of the vein graft to the second  posterolateral branch.  She was seen in consultation by Dr. Andrey Spearman for consideration of bypass surgery.  However, he felt that  the vessels on the left coronary system were poor targets for grafting.  Since she only had moderate disease in the right coronary distribution,  it was felt that she would be best treated medically unless she had  refractory anginal symptoms.  As such, she was begun on Plavix.  She was  begun on nitrates in addition to her other medications.  She had no  significant anginal symptoms.  She was started on Cipro for a urinary  tract infection and was progressively ambulated.  She was discharged  home on February 02, 2006, in stable condition.   DISCHARGE DIAGNOSIS:  1. Non-Q-wave myocardial infarction.  2. Coronary disease status post coronary artery bypass grafting, now      with occlusion of both vein grafts.  She still has a patent left      internal mammary artery graft to the left anterior descending .  3. Urinary tract infection with Escherichia coli.  4. Hypertension.  5. Hyperlipidemia  6. Status post left nephrectomy.  7. Status post cholecystectomy.   DISCHARGE MEDICATIONS:  1. Aspirin 325 mg per day.  2. Plavix 75 mg daily.  3. Toprol XL 100 mg daily.  4. Imdur 30 mg per day.  5. Norvasc 5 mg daily.  6. Benicar 20 mg per day.  7. Prempro 2.5/0.0625 mg daily.  8. Nitroglycerin 0.4 mg sublingual p.r.n..  9. Prilosec 20 mg per day.  10.Cipro 250 mg twice a day for 7 days.  11.Multivitamin daily.  12.Zocor 40 mg per day.   The patient will follow up with Dr. Swaziland in 2 weeks.  She is to not  drive for 1 week.  She will remain on low-salt, low-fat diet.  She was  referred to phase 2 outpatient cardiac rehab.   DISCHARGE STATUS:  Improved.  ADDENDUM:  The patient did have significant headache  during her hospital  stay.  She underwent a CT of her head which showed chronic small vessel  changes but no acute changes.  It was felt that her headache was related  to nitrate therapy.           ______________________________  Peter M. [place], M.D.     PMJ/MEDQ  D:  02/02/2006  T:  02/02/2006  Job:  [id number]   cc:   Barry Dienes. Eloise Harman, M.D.  Salvatore Decent Dorris Fetch, M.D.

## 2010-08-20 ENCOUNTER — Other Ambulatory Visit: Payer: Self-pay | Admitting: Cardiology

## 2010-08-20 NOTE — Telephone Encounter (Signed)
Samples for renexa and benicar pulled. No plavix available.

## 2010-08-20 NOTE — Telephone Encounter (Signed)
Wants to know if we have any samples of renexa, benecar and plavx. She has an appointment tomorrow and would like to pick them up then if she can.

## 2010-08-21 ENCOUNTER — Other Ambulatory Visit (INDEPENDENT_AMBULATORY_CARE_PROVIDER_SITE_OTHER): Payer: Medicare Other | Admitting: *Deleted

## 2010-08-21 ENCOUNTER — Encounter: Payer: Self-pay | Admitting: Cardiology

## 2010-08-21 ENCOUNTER — Ambulatory Visit (INDEPENDENT_AMBULATORY_CARE_PROVIDER_SITE_OTHER): Payer: Medicare Other | Admitting: Cardiology

## 2010-08-21 DIAGNOSIS — E7800 Pure hypercholesterolemia, unspecified: Secondary | ICD-10-CM

## 2010-08-21 DIAGNOSIS — I10 Essential (primary) hypertension: Secondary | ICD-10-CM

## 2010-08-21 DIAGNOSIS — E785 Hyperlipidemia, unspecified: Secondary | ICD-10-CM

## 2010-08-21 DIAGNOSIS — I2510 Atherosclerotic heart disease of native coronary artery without angina pectoris: Secondary | ICD-10-CM

## 2010-08-21 LAB — BASIC METABOLIC PANEL
BUN: 17 mg/dL (ref 6–23)
CO2: 29 mEq/L (ref 19–32)
Calcium: 9.3 mg/dL (ref 8.4–10.5)
Creatinine, Ser: 0.6 mg/dL (ref 0.4–1.2)
GFR: 98.19 mL/min (ref >60.00)
Glucose, Bld: 107 mg/dL — ABNORMAL HIGH (ref 70–99)
Sodium: 139 mEq/L (ref 135–145)

## 2010-08-21 LAB — HEPATIC FUNCTION PANEL
Albumin: 3.9 g/dL (ref 3.5–5.2)
Alkaline Phosphatase: 60 U/L (ref 39–117)
Total Protein: 6.8 g/dL (ref 6.0–8.3)

## 2010-08-21 LAB — LIPID PANEL
HDL: 44.9 mg/dL (ref >39.00)
LDL Cholesterol: 98 mg/dL (ref 0–99)
Total CHOL/HDL Ratio: 4
VLDL: 31.4 mg/dL (ref 0.0–40.0)

## 2010-08-21 NOTE — Assessment & Plan Note (Signed)
Lipids are much improved since she has been taking her Crestor. We will continue on her current medical therapy.

## 2010-08-21 NOTE — Assessment & Plan Note (Signed)
She has stable anginal symptoms. She has poor targets and is not a good candidate for reoperation. We will continue with her current antianginal therapy and followup again in 6 months.

## 2010-08-21 NOTE — Patient Instructions (Signed)
We will call with the results of your blood work today.  I encourage you to get more aerobic exercise with daily walking  I will see you back in 6 months.

## 2010-08-21 NOTE — Progress Notes (Signed)
[name] Date of Birth: Aug 05, 1934   History of Present Illness: [name] is seen for followup today. She states she is doing better. Her chest pain has been much less frequent. She only has pain about every 3 months and it is relieved with sublingual nitroglycerin. This usually occurs when she is under stress or overly tired. She denies any dyspnea or palpitations.  Current Outpatient Prescriptions on File Prior to Visit  Medication Sig Dispense Refill  . amLODipine (NORVASC) 5 MG tablet Take 5 mg by mouth daily.        [name] aspirin 325 MG tablet Take 325 mg by mouth daily.        . Calcium Carbonate (CALTRATE 600 PO) Take by mouth daily.        . clopidogrel (PLAVIX) 75 MG tablet Take 75 mg by mouth daily.        . Lansoprazole (PREVACID PO) Take by mouth daily.        . metoprolol tartrate (LOPRESSOR) 25 MG tablet Take 25 mg by mouth 2 (two) times daily.        [name] olmesartan (BENICAR) 40 MG tablet Take 40 mg by mouth daily.        . Omega-3 Fatty Acids (FISH OIL) 1000 MG CAPS Take by mouth daily.        . ranolazine (RANEXA) 500 MG 12 hr tablet Take 500 mg by mouth 2 (two) times daily.        . rosuvastatin (CRESTOR) 20 MG tablet Take 20 mg by mouth daily.        [name] 0.4 MG SL tablet       . DISCONTD: isosorbide mononitrate (IMDUR) 60 MG 24 hr tablet Take 60 mg by mouth daily.          Allergies  Allergen Reactions  . Demerol Nausea And Vomiting  . Sulfa Antibiotics Other (See Comments)    Causes weakness  . Niaspan (Niacin (Antihyperlipidemic))     Past Medical History  Diagnosis Date  . Coronary artery disease     STATUS POST CABG  . Hypertension   . Hyperlipidemia   . Anxiety   . DA (degenerative arthritis)   . Skin cancer   . Hearing loss   . Heartburn     Past Surgical History  Procedure Date  . Cardiac catheterization 04/16/2007    NORMAL LEFT VENTRICULAR SIZE AND CONTRACTILITY WITH NORMAL SYSTOLIC FUNCTION. EF 60%. THERE IS MILD TO MODERATE MITRAL  INSUFFICIENCY  . Cholecystectomy   . Nephrectomy     LEFT  . Tubal ligation   . Skin cancer excision   . Coronary artery bypass graft 1986    X3, LIMA GRAFT TO THE LAD. SHE HAS POOR TARGETS    History  Smoking status  . Never Smoker   Smokeless tobacco  . Not on file    History  Alcohol Use No    History reviewed. No pertinent family history.  Review of Systems:  All other systems were reviewed and are negative.  Physical Exam: BP 128/76  Pulse 64  Wt 152 lb (68.947 kg) She is a pleasant white female in no acute distress. Her HEENT exam is unremarkable. She has no JVD or bruits. Lungs are clear. Cardiac exam reveals a regular rate and rhythm without gallop or murmur. She has no edema. Pedal pulses are good. LABORATORY DATA: Complete chemistry panel is normal. Total cholesterol is 174, triglycerides 157, LDL 98, HDL 45.  Assessment /  Plan:

## 2010-08-21 NOTE — Assessment & Plan Note (Signed)
Blood pressure is well controlled on her current medications. 

## 2010-08-22 ENCOUNTER — Telehealth: Payer: Self-pay | Admitting: *Deleted

## 2010-08-22 NOTE — Telephone Encounter (Signed)
Notified of lab results. Will send copy to Dr. Eloise Harman

## 2010-08-22 NOTE — Telephone Encounter (Signed)
Message copied by Lorayne Bender on Thu Aug 22, 2010  4:03 PM ------      Message from: [place], PETER M      Created: Wed Aug 21, 2010  4:14 PM       Chemistries are normal. Lipids look much better. LDL reduced from 140 to 98. Continue Rx.

## 2010-10-02 ENCOUNTER — Other Ambulatory Visit: Payer: Self-pay | Admitting: Internal Medicine

## 2010-10-02 DIAGNOSIS — M549 Dorsalgia, unspecified: Secondary | ICD-10-CM

## 2010-10-14 ENCOUNTER — Other Ambulatory Visit: Payer: Self-pay | Admitting: Internal Medicine

## 2010-10-14 ENCOUNTER — Ambulatory Visit
Admission: RE | Admit: 2010-10-14 | Discharge: 2010-10-14 | Disposition: A | Discharge status: Home / Self Care | Payer: Medicare Other | Source: Ambulatory Visit | Attending: Internal Medicine | Admitting: Internal Medicine

## 2010-10-14 DIAGNOSIS — M549 Dorsalgia, unspecified: Secondary | ICD-10-CM

## 2010-10-14 MED ORDER — METHYLPREDNISOLONE ACETATE 40 MG/ML INJ SUSP (RADIOLOG
120.0000 mg | Freq: Once | INTRAMUSCULAR | Status: AC
  Administered 2010-10-14: 120 mg via EPIDURAL

## 2010-10-14 MED ORDER — IOHEXOL 180 MG/ML  SOLN
1.0000 mL | Freq: Once | INTRAMUSCULAR | Status: AC | PRN
  Administered 2010-10-14: 1 mL via EPIDURAL

## 2010-11-09 ENCOUNTER — Other Ambulatory Visit: Payer: Self-pay | Admitting: Cardiology

## 2010-11-11 NOTE — Telephone Encounter (Signed)
escribe medication per fax request  

## 2010-11-22 LAB — CBC
HCT: 41.3
Hemoglobin: 13
Hemoglobin: 14.5
MCHC: 35
MCHC: 35
MCV: 101.1 — ABNORMAL HIGH
MCV: 101.1 — ABNORMAL HIGH
Platelets: 160
Platelets: 157
RBC: 4.09
RBC: 3.94
RDW: 13.6
WBC: 8.6

## 2010-11-22 LAB — BASIC METABOLIC PANEL
BUN: 7
CO2: 27
Calcium: 8.4
Chloride: 105
Creatinine, Ser: 0.62
Creatinine, Ser: 0.68
GFR calc Af Amer: >60
GFR calc Af Amer: >60
GFR calc non Af Amer: >60
Potassium: 3.8
Sodium: 138

## 2010-11-22 LAB — CK TOTAL AND CKMB (NOT AT ARMC)
Total CK: 40
Total CK: 42

## 2010-11-22 LAB — LIPID PANEL
Cholesterol: 165
HDL: 31 — ABNORMAL LOW
LDL Cholesterol: 91
Total CHOL/HDL Ratio: 5.3
VLDL: 43 — ABNORMAL HIGH

## 2010-11-22 LAB — COMPREHENSIVE METABOLIC PANEL
ALT: 24
Alkaline Phosphatase: 35 — ABNORMAL LOW
GFR calc non Af Amer: >60
Glucose, Bld: 132 — ABNORMAL HIGH
Potassium: 3.6
Sodium: 137
Total Protein: 7

## 2010-11-22 LAB — HEMOGLOBIN A1C
Hgb A1c MFr Bld: 5.6
Mean Plasma Glucose: 122

## 2010-11-22 LAB — APTT: aPTT: 24

## 2010-11-22 LAB — PROTIME-INR: INR: 0.9

## 2011-01-16 ENCOUNTER — Other Ambulatory Visit: Payer: Self-pay | Admitting: Cardiology

## 2011-02-12 ENCOUNTER — Telehealth: Payer: Self-pay | Admitting: Cardiology

## 2011-02-12 NOTE — Telephone Encounter (Signed)
Pt needs samples of renexa, benicar, plavix, & crestor that she can get at her appointment tomorrow

## 2011-02-12 NOTE — Telephone Encounter (Signed)
Have some benicar available but do not have Ranexa 500 mg, Plavix, or crestor. Will pick up at app Wed.

## 2011-02-13 ENCOUNTER — Encounter: Payer: Self-pay | Admitting: Cardiology

## 2011-02-13 ENCOUNTER — Ambulatory Visit (INDEPENDENT_AMBULATORY_CARE_PROVIDER_SITE_OTHER): Payer: Medicare Other | Admitting: Cardiology

## 2011-02-13 VITALS — BP 118/70 | HR 62 | Ht 61.0 in | Wt 151.4 lb

## 2011-02-13 DIAGNOSIS — E785 Hyperlipidemia, unspecified: Secondary | ICD-10-CM

## 2011-02-13 DIAGNOSIS — I2510 Atherosclerotic heart disease of native coronary artery without angina pectoris: Secondary | ICD-10-CM

## 2011-02-13 DIAGNOSIS — I10 Essential (primary) hypertension: Secondary | ICD-10-CM

## 2011-02-13 MED ORDER — ATORVASTATIN CALCIUM 40 MG PO TABS: 40.0000 mg | ORAL_TABLET | Freq: Every day | ORAL | Status: DC

## 2011-02-13 MED ORDER — RANOLAZINE ER 500 MG PO TB12: 500.0000 mg | ORAL_TABLET | Freq: Two times a day (BID) | ORAL | Status: DC

## 2011-02-13 MED ORDER — OLMESARTAN MEDOXOMIL 40 MG PO TABS: 40.0000 mg | ORAL_TABLET | Freq: Every day | ORAL | Status: DC

## 2011-02-13 MED ORDER — CLOPIDOGREL BISULFATE 75 MG PO TABS: 75.0000 mg | ORAL_TABLET | Freq: Every day | ORAL | Status: DC

## 2011-02-13 NOTE — Patient Instructions (Addendum)
Continue your current therapy.  I will see you again in 6 months.  If affordable we will switch crestor to lipitor 40 mg daily.

## 2011-02-13 NOTE — Progress Notes (Signed)
[name] Date of Birth: 1934/03/23   History of Present Illness: [name] is seen for followup today. She states she is doing about the same. She still gets tired easily. When she gets more fatigued she develops some chest pain. This is usually relieved with rest but she does use nitroglycerin on occasion particularly at night. She reports that there is no real change in the pattern of her chest pain. She does complain of a lot of pain in her back and legs and thinks that she will need another steroid injection.  Current Outpatient Prescriptions on File Prior to Visit  Medication Sig Dispense Refill  . amLODipine (NORVASC) 5 MG tablet Take 5 mg by mouth daily.        [name] aspirin 325 MG tablet Take 325 mg by mouth daily.        . Calcium Carbonate (CALTRATE 600 PO) Take by mouth daily.        . isosorbide mononitrate (IMDUR) 60 MG 24 hr tablet TAKE 1 TABLET DAILY  90 tablet  3  . Lansoprazole (PREVACID PO) Take by mouth daily.        . metoprolol tartrate (LOPRESSOR) 25 MG tablet Take 25 mg by mouth 2 (two) times daily.        [name] 0.4 MG SL tablet       . Omega-3 Fatty Acids (FISH OIL) 1000 MG CAPS Take by mouth daily.        [name] DISCONTD: clopidogrel (PLAVIX) 75 MG tablet Take 75 mg by mouth daily.        [name] DISCONTD: olmesartan (BENICAR) 40 MG tablet Take 40 mg by mouth daily.        [name] DISCONTD: ranolazine (RANEXA) 500 MG 12 hr tablet Take 500 mg by mouth 2 (two) times daily.          Allergies  Allergen Reactions  . Demerol Nausea And Vomiting  . Sulfa Antibiotics Other (See Comments)    Causes weakness  . Niaspan (Niacin (Antihyperlipidemic))     Past Medical History  Diagnosis Date  . Coronary artery disease     STATUS POST CABG  . Hypertension   . Hyperlipidemia   . Anxiety   . DA (degenerative arthritis)   . Skin cancer   . Hearing loss   . Heartburn     Past Surgical History  Procedure Date  . Cardiac catheterization 04/16/2007    NORMAL LEFT  VENTRICULAR SIZE AND CONTRACTILITY WITH NORMAL SYSTOLIC FUNCTION. EF 60%. THERE IS MILD TO MODERATE MITRAL INSUFFICIENCY  . Cholecystectomy   . Nephrectomy     LEFT  . Tubal ligation   . Skin cancer excision   . Coronary artery bypass graft 1986    X3, LIMA GRAFT TO THE LAD. SHE HAS POOR TARGETS    History  Smoking status  . Never Smoker   Smokeless tobacco  . Not on file    History  Alcohol Use No    History reviewed. No pertinent family history.  Review of Systems: As noted in history of present illness. All other systems were reviewed and are negative.  Physical Exam: BP 118/70  Pulse 62  Ht 5\' 1"  (1.549 m)  Wt 151 lb 6.4 oz (68.675 kg)  BMI 28.61 kg/m2 She is a pleasant white female in no acute distress. Her HEENT exam is unremarkable. She has no JVD or bruits. Lungs are clear. Cardiac exam reveals a regular rate and rhythm without gallop or murmur.  She has no edema. Pedal pulses are good. LABORATORY DATA: ECG today is normal.  Assessment / Plan:

## 2011-02-13 NOTE — Assessment & Plan Note (Signed)
Blood pressure control is excellent. 

## 2011-02-13 NOTE — Assessment & Plan Note (Signed)
I have offered to switch her Crestor to Lipitor 40 mg daily if this will help her afford her medications better. She will switch to generic Plavix. Her more expensive medications such as Ranexa and Benicar she gets from [place].

## 2011-02-13 NOTE — Assessment & Plan Note (Signed)
She has poor targets for further revascularization. We will continue with her medical therapy. I did offer to increase her Ranexa but she feels that she is doing well on her current therapy.

## 2011-03-06 ENCOUNTER — Other Ambulatory Visit: Payer: Self-pay | Admitting: Internal Medicine

## 2011-03-06 DIAGNOSIS — M545 Low back pain: Secondary | ICD-10-CM

## 2011-03-18 ENCOUNTER — Telehealth: Payer: Self-pay | Admitting: Cardiology

## 2011-03-18 MED ORDER — LOSARTAN POTASSIUM 100 MG PO TABS: 100.0000 mg | ORAL_TABLET | Freq: Every day | ORAL | Status: DC

## 2011-03-18 NOTE — Telephone Encounter (Signed)
New msg: pt calling wanting to know if there is a medication comparable to benicar as well as a comparable for ranexa. Please return pt call to discuss further.

## 2011-03-18 NOTE — Telephone Encounter (Signed)
Patient called wanting Dr.Jordan to prescribe another drug to replace benicar.States benicar cost too much.Spoke to Dr.Jordan,he prescribed losartan 100 mg daily.

## 2011-03-21 ENCOUNTER — Ambulatory Visit
Admission: RE | Admit: 2011-03-21 | Discharge: 2011-03-21 | Disposition: A | Discharge status: Home / Self Care | Payer: Medicare Other | Source: Ambulatory Visit | Attending: Internal Medicine | Admitting: Internal Medicine

## 2011-03-21 ENCOUNTER — Other Ambulatory Visit: Payer: Self-pay | Admitting: Internal Medicine

## 2011-03-21 VITALS — BP 112/54 | HR 60

## 2011-03-21 DIAGNOSIS — M545 Low back pain: Secondary | ICD-10-CM

## 2011-03-21 DIAGNOSIS — M47817 Spondylosis without myelopathy or radiculopathy, lumbosacral region: Secondary | ICD-10-CM | POA: Diagnosis not present

## 2011-03-21 MED ORDER — IOHEXOL 180 MG/ML  SOLN
1.0000 mL | Freq: Once | INTRAMUSCULAR | Status: AC | PRN
  Administered 2011-03-21: 1 mL via EPIDURAL

## 2011-03-21 MED ORDER — METHYLPREDNISOLONE ACETATE 40 MG/ML INJ SUSP (RADIOLOG
120.0000 mg | Freq: Once | INTRAMUSCULAR | Status: AC
  Administered 2011-03-21: 120 mg via EPIDURAL

## 2011-03-26 DIAGNOSIS — E785 Hyperlipidemia, unspecified: Secondary | ICD-10-CM | POA: Diagnosis not present

## 2011-03-26 DIAGNOSIS — M545 Low back pain: Secondary | ICD-10-CM | POA: Diagnosis not present

## 2011-03-26 DIAGNOSIS — R42 Dizziness and giddiness: Secondary | ICD-10-CM | POA: Diagnosis not present

## 2011-05-14 DIAGNOSIS — R0789 Other chest pain: Secondary | ICD-10-CM | POA: Diagnosis not present

## 2011-05-14 DIAGNOSIS — K219 Gastro-esophageal reflux disease without esophagitis: Secondary | ICD-10-CM | POA: Diagnosis not present

## 2011-05-14 DIAGNOSIS — I2510 Atherosclerotic heart disease of native coronary artery without angina pectoris: Secondary | ICD-10-CM | POA: Diagnosis not present

## 2011-05-14 DIAGNOSIS — R072 Precordial pain: Secondary | ICD-10-CM | POA: Diagnosis not present

## 2011-05-14 DIAGNOSIS — E559 Vitamin D deficiency, unspecified: Secondary | ICD-10-CM | POA: Diagnosis not present

## 2011-05-14 DIAGNOSIS — I4891 Unspecified atrial fibrillation: Secondary | ICD-10-CM | POA: Diagnosis not present

## 2011-05-14 DIAGNOSIS — Z66 Do not resuscitate: Secondary | ICD-10-CM | POA: Diagnosis not present

## 2011-05-14 DIAGNOSIS — Z951 Presence of aortocoronary bypass graft: Secondary | ICD-10-CM | POA: Diagnosis not present

## 2011-05-14 DIAGNOSIS — I10 Essential (primary) hypertension: Secondary | ICD-10-CM | POA: Diagnosis not present

## 2011-05-14 DIAGNOSIS — R079 Chest pain, unspecified: Secondary | ICD-10-CM | POA: Diagnosis not present

## 2011-05-14 DIAGNOSIS — E785 Hyperlipidemia, unspecified: Secondary | ICD-10-CM | POA: Diagnosis not present

## 2011-05-14 DIAGNOSIS — Z79899 Other long term (current) drug therapy: Secondary | ICD-10-CM | POA: Diagnosis not present

## 2011-05-14 DIAGNOSIS — D72820 Lymphocytosis (symptomatic): Secondary | ICD-10-CM | POA: Diagnosis not present

## 2011-05-14 DIAGNOSIS — Z7982 Long term (current) use of aspirin: Secondary | ICD-10-CM | POA: Diagnosis not present

## 2011-05-15 DIAGNOSIS — I2510 Atherosclerotic heart disease of native coronary artery without angina pectoris: Secondary | ICD-10-CM | POA: Diagnosis not present

## 2011-05-15 DIAGNOSIS — R079 Chest pain, unspecified: Secondary | ICD-10-CM | POA: Diagnosis not present

## 2011-05-15 DIAGNOSIS — D72820 Lymphocytosis (symptomatic): Secondary | ICD-10-CM | POA: Diagnosis not present

## 2011-05-28 ENCOUNTER — Ambulatory Visit (INDEPENDENT_AMBULATORY_CARE_PROVIDER_SITE_OTHER): Payer: Medicare Other | Admitting: Cardiology

## 2011-05-28 ENCOUNTER — Encounter: Payer: Self-pay | Admitting: Cardiology

## 2011-05-28 VITALS — BP 122/68 | HR 63 | Ht 61.0 in | Wt 151.0 lb

## 2011-05-28 DIAGNOSIS — I4891 Unspecified atrial fibrillation: Secondary | ICD-10-CM | POA: Diagnosis not present

## 2011-05-28 DIAGNOSIS — I10 Essential (primary) hypertension: Secondary | ICD-10-CM

## 2011-05-28 DIAGNOSIS — I209 Angina pectoris, unspecified: Secondary | ICD-10-CM | POA: Insufficient documentation

## 2011-05-28 DIAGNOSIS — I2510 Atherosclerotic heart disease of native coronary artery without angina pectoris: Secondary | ICD-10-CM | POA: Diagnosis not present

## 2011-05-28 MED ORDER — ASPIRIN EC 81 MG PO TBEC
81.0000 mg | DELAYED_RELEASE_TABLET | Freq: Every day | ORAL | Status: AC
Start: 2011-05-28 — End: 2012-05-27

## 2011-05-28 MED ORDER — WARFARIN SODIUM 3 MG PO TABS: 3.0000 mg | ORAL_TABLET | Freq: Every day | ORAL | Status: DC

## 2011-05-28 NOTE — Assessment & Plan Note (Signed)
She did have increased anginal symptoms associated with atrial fibrillation. She is status post CABG in 1986. Cardiac catheterization 2009 showed occlusion of her vein grafts with a patent LIMA to the LAD. She had poor target vessels it was not suitable for redo bypass. She has been managed medically. Since her chest pain was only associated with atrial fibrillation I don't recommend further evaluation at this point she were to have more persistent anginal symptoms.

## 2011-05-28 NOTE — Assessment & Plan Note (Signed)
Blood pressure is under excellent control. 

## 2011-05-28 NOTE — Patient Instructions (Signed)
Stop taking Plavix.  Reduce ASA to 81 mg daily  Start warfarin 3 mg daily.  We will schedule you for an echocardiogram  I will see you back in 6 weeks.

## 2011-05-28 NOTE — Progress Notes (Signed)
Audley Hose Date of Birth: 03-27-1934   History of Present Illness: [name] is seen for followup today. She was recently admitted to [facility] hospital 1 week ago after she developed rapid heartbeat and chest pain. Her chest pain radiating to her left shoulder. It lasted about one hour. And she arrived at the hospital she was noted to be in atrial fibrillation. She converted spontaneously. She apparently ruled out for myocardial infarction and was discharged home the next day. Is the only episode of tachycardia that she has had. Otherwise her anginal symptoms have been under good control. She has no history of stroke or TIA. She does complain of being unsteady on her feet.  Current Outpatient Prescriptions on File Prior to Visit  Medication Sig Dispense Refill  . amLODipine (NORVASC) 5 MG tablet Take 5 mg by mouth daily.        [name] atorvastatin (LIPITOR) 40 MG tablet Take 1 tablet (40 mg total) by mouth daily.  90 tablet  3  . Calcium Carbonate (CALTRATE 600 PO) Take by mouth daily.        . isosorbide mononitrate (IMDUR) 60 MG 24 hr tablet TAKE 1 TABLET DAILY  90 tablet  3  . Lansoprazole (PREVACID PO) Take by mouth daily.        [name] losartan (COZAAR) 100 MG tablet Take 1 tablet (100 mg total) by mouth daily.  90 tablet  3  . metoprolol tartrate (LOPRESSOR) 25 MG tablet Take 25 mg by mouth 2 (two) times daily.        [name] 0.4 MG SL tablet Place 0.4 mg under the tongue every 5 (five) minutes as needed.       . Omega-3 Fatty Acids (FISH OIL) 1000 MG CAPS Take by mouth daily.        . ranolazine (RANEXA) 500 MG 12 hr tablet Take 1 tablet (500 mg total) by mouth 2 (two) times daily.  180 tablet  3    Allergies  Allergen Reactions  . Demerol Nausea And Vomiting  . Sulfa Antibiotics Other (See Comments)    Causes weakness  . Niaspan (Niacin (Antihyperlipidemic))     Past Medical History  Diagnosis Date  . Coronary artery disease     STATUS POST CABG  . Hypertension   .  Hyperlipidemia   . Anxiety   . DA (degenerative arthritis)   . Skin cancer   . Hearing loss   . Heartburn     Past Surgical History  Procedure Date  . Cardiac catheterization 04/16/2007    NORMAL LEFT VENTRICULAR SIZE AND CONTRACTILITY WITH NORMAL SYSTOLIC FUNCTION. EF 60%. THERE IS MILD TO MODERATE MITRAL INSUFFICIENCY  . Cholecystectomy   . Nephrectomy     LEFT  . Tubal ligation   . Skin cancer excision   . Coronary artery bypass graft 1986    X3, LIMA GRAFT TO THE LAD. SHE HAS POOR TARGETS    History  Smoking status  . Never Smoker   Smokeless tobacco  . Not on file    History  Alcohol Use No    History reviewed. No pertinent family history.  Review of Systems: As noted in history of present illness. All other systems were reviewed and are negative.  Physical Exam: BP 122/68  Pulse 63  Ht 5\' 1"  (1.549 m)  Wt 151 lb (68.493 kg)  BMI 28.53 kg/m2 She is a pleasant white female in no acute distress. Her HEENT exam reveals that she is normocephalic, atraumatic.  Pupils are equal round and reactive to light sclera are clear. Oropharynx is clear.. She has no JVD or bruits. No adenopathy or thyromegaly. Lungs are clear. Cardiac exam reveals a regular rate and rhythm without gallop or murmur. Abdomen is soft and nontender without masses or bruits. She has no edema. Pedal pulses are good. Skin is warm and dry. She is alert oriented x3. Cranial nerves II through XII are intact. LABORATORY DATA: ECG today is normal.  Assessment / Plan:

## 2011-05-28 NOTE — Assessment & Plan Note (Signed)
She apparently has had one episode of self terminating atrial fibrillation. This was associated with increasing angina with her known severe coronary disease. At this point I recommend continued rate control with metoprolol. We will obtain a copy of her records from the hospital. We will schedule her for an echocardiogram. I have recommended anticoagulation with Coumadin since she is at increased risk of stroke with a [place] score of at least 2. Start her on 3 mg daily. I will stop her Plavix and reduce her aspirin to 81 mg daily. She will be followed in our Coumadin clinic. Will followup again in 6 weeks. If she has recurrent episodes of atrial fibrillation we will initiate antiarrhythmic drug therapy since she is quite symptomatic with her atrial fibrillation.

## 2011-06-03 ENCOUNTER — Ambulatory Visit (INDEPENDENT_AMBULATORY_CARE_PROVIDER_SITE_OTHER): Payer: Medicare Other

## 2011-06-03 DIAGNOSIS — I209 Angina pectoris, unspecified: Secondary | ICD-10-CM | POA: Diagnosis not present

## 2011-06-03 DIAGNOSIS — Z7901 Long term (current) use of anticoagulants: Secondary | ICD-10-CM | POA: Diagnosis not present

## 2011-06-03 DIAGNOSIS — I2510 Atherosclerotic heart disease of native coronary artery without angina pectoris: Secondary | ICD-10-CM

## 2011-06-03 DIAGNOSIS — I10 Essential (primary) hypertension: Secondary | ICD-10-CM

## 2011-06-03 DIAGNOSIS — I4891 Unspecified atrial fibrillation: Secondary | ICD-10-CM | POA: Diagnosis not present

## 2011-06-03 LAB — POCT INR: INR: 1.4

## 2011-06-03 NOTE — Patient Instructions (Signed)

## 2011-06-10 ENCOUNTER — Ambulatory Visit (INDEPENDENT_AMBULATORY_CARE_PROVIDER_SITE_OTHER): Payer: Medicare Other | Admitting: Pharmacist

## 2011-06-10 ENCOUNTER — Ambulatory Visit (HOSPITAL_COMMUNITY): Payer: Medicare Other | Attending: Cardiology

## 2011-06-10 ENCOUNTER — Other Ambulatory Visit: Payer: Self-pay

## 2011-06-10 DIAGNOSIS — I4891 Unspecified atrial fibrillation: Secondary | ICD-10-CM | POA: Insufficient documentation

## 2011-06-10 DIAGNOSIS — Z7901 Long term (current) use of anticoagulants: Secondary | ICD-10-CM

## 2011-06-10 DIAGNOSIS — E785 Hyperlipidemia, unspecified: Secondary | ICD-10-CM | POA: Insufficient documentation

## 2011-06-10 DIAGNOSIS — I517 Cardiomegaly: Secondary | ICD-10-CM | POA: Insufficient documentation

## 2011-06-10 DIAGNOSIS — I10 Essential (primary) hypertension: Secondary | ICD-10-CM | POA: Diagnosis not present

## 2011-06-10 DIAGNOSIS — I2510 Atherosclerotic heart disease of native coronary artery without angina pectoris: Secondary | ICD-10-CM | POA: Diagnosis not present

## 2011-06-10 DIAGNOSIS — I059 Rheumatic mitral valve disease, unspecified: Secondary | ICD-10-CM | POA: Diagnosis not present

## 2011-06-10 LAB — POCT INR: INR: 3.1

## 2011-06-17 ENCOUNTER — Ambulatory Visit (INDEPENDENT_AMBULATORY_CARE_PROVIDER_SITE_OTHER): Payer: Medicare Other

## 2011-06-17 DIAGNOSIS — Z7901 Long term (current) use of anticoagulants: Secondary | ICD-10-CM | POA: Diagnosis not present

## 2011-06-17 DIAGNOSIS — I4891 Unspecified atrial fibrillation: Secondary | ICD-10-CM

## 2011-06-17 LAB — POCT INR: INR: 3

## 2011-06-24 ENCOUNTER — Ambulatory Visit (INDEPENDENT_AMBULATORY_CARE_PROVIDER_SITE_OTHER): Payer: Medicare Other | Admitting: *Deleted

## 2011-06-24 DIAGNOSIS — I4891 Unspecified atrial fibrillation: Secondary | ICD-10-CM | POA: Diagnosis not present

## 2011-06-24 DIAGNOSIS — Z7901 Long term (current) use of anticoagulants: Secondary | ICD-10-CM | POA: Diagnosis not present

## 2011-07-08 ENCOUNTER — Ambulatory Visit (INDEPENDENT_AMBULATORY_CARE_PROVIDER_SITE_OTHER): Payer: Medicare Other | Admitting: Pharmacist

## 2011-07-08 DIAGNOSIS — I4891 Unspecified atrial fibrillation: Secondary | ICD-10-CM

## 2011-07-08 DIAGNOSIS — Z7901 Long term (current) use of anticoagulants: Secondary | ICD-10-CM

## 2011-07-08 LAB — POCT INR: INR: 3.3

## 2011-07-16 ENCOUNTER — Ambulatory Visit (INDEPENDENT_AMBULATORY_CARE_PROVIDER_SITE_OTHER): Payer: Medicare Other | Admitting: Cardiology

## 2011-07-16 ENCOUNTER — Encounter: Payer: Self-pay | Admitting: Cardiology

## 2011-07-16 VITALS — BP 133/72 | HR 61 | Ht 61.0 in | Wt 151.0 lb

## 2011-07-16 DIAGNOSIS — I10 Essential (primary) hypertension: Secondary | ICD-10-CM | POA: Diagnosis not present

## 2011-07-16 DIAGNOSIS — I4891 Unspecified atrial fibrillation: Secondary | ICD-10-CM | POA: Diagnosis not present

## 2011-07-16 DIAGNOSIS — I2510 Atherosclerotic heart disease of native coronary artery without angina pectoris: Secondary | ICD-10-CM

## 2011-07-16 NOTE — Patient Instructions (Signed)
Continue your current medication  I will see you again in 3 months   

## 2011-07-17 NOTE — Assessment & Plan Note (Signed)
She has known severe coronary disease. She does have a patent IMA graft to the LAD. Her other grafts are occluded. By cardiac catheterization in 2009 there did not appear to be lesion suitable for PCI. She has been managed medications. She is on aggressive antianginal therapy including amlodipine, isosorbide, metoprolol, and Ranexa. I suspect that her dizziness is related to Ranexa. She states she cannot stop this medication because it really helps with her chest pain.

## 2011-07-17 NOTE — Progress Notes (Signed)
[name] Date of Birth: 01-19-1935   History of Present Illness: [name] is seen for followup today. She reports that she has had no further spells since her hospitalization in March. She denies any palpitations, chest pain, or shortness of breath. She does complain of frequent dizziness.  Current Outpatient Prescriptions on File Prior to Visit  Medication Sig Dispense Refill  . amLODipine (NORVASC) 5 MG tablet Take 5 mg by mouth daily.        [name] aspirin EC 81 MG tablet Take 1 tablet (81 mg total) by mouth daily.  150 tablet  2  . atorvastatin (LIPITOR) 40 MG tablet Take 1 tablet (40 mg total) by mouth daily.  90 tablet  3  . Calcium Carbonate (CALTRATE 600 PO) Take by mouth daily.        . isosorbide mononitrate (IMDUR) 60 MG 24 hr tablet TAKE 1 TABLET DAILY  90 tablet  3  . Lansoprazole (PREVACID PO) Take by mouth daily.        [name] losartan (COZAAR) 100 MG tablet Take 1 tablet (100 mg total) by mouth daily.  90 tablet  3  . metoprolol tartrate (LOPRESSOR) 25 MG tablet Take 25 mg by mouth 2 (two) times daily.        [name] 0.4 MG SL tablet Place 0.4 mg under the tongue every 5 (five) minutes as needed.       . Omega-3 Fatty Acids (FISH OIL) 1000 MG CAPS Take by mouth daily.        . ranolazine (RANEXA) 500 MG 12 hr tablet Take 1 tablet (500 mg total) by mouth 2 (two) times daily.  180 tablet  3  . warfarin (JANTOVEN) 3 MG tablet Take 1 tablet (3 mg total) by mouth daily.  30 tablet  3    Allergies  Allergen Reactions  . Demerol Nausea And Vomiting  . Sulfa Antibiotics Other (See Comments)    Causes weakness  . Niaspan (Niacin Er (Antihyperlipidemic))     Past Medical History  Diagnosis Date  . Coronary artery disease     STATUS POST CABG  . Hypertension   . Hyperlipidemia   . Anxiety   . DA (degenerative arthritis)   . Skin cancer   . Hearing loss   . Heartburn   . Atrial fibrillation     Past Surgical History  Procedure Date  . Cardiac catheterization  04/16/2007    NORMAL LEFT VENTRICULAR SIZE AND CONTRACTILITY WITH NORMAL SYSTOLIC FUNCTION. EF 60%. THERE IS MILD TO MODERATE MITRAL INSUFFICIENCY  . Cholecystectomy   . Nephrectomy     LEFT  . Tubal ligation   . Skin cancer excision   . Coronary artery bypass graft 1986    X3, LIMA GRAFT TO THE LAD. SHE HAS POOR TARGETS    History  Smoking status  . Never Smoker   Smokeless tobacco  . Not on file    History  Alcohol Use No    History reviewed. No pertinent family history.  Review of Systems: As noted in history of present illness. She does have a fungal infection of her right great toenail. All other systems were reviewed and are negative.  Physical Exam: BP 133/72  Pulse 61  Ht 5\' 1"  (1.549 m)  Wt 151 lb (68.493 kg)  BMI 28.53 kg/m2 She is a pleasant white female in no acute distress. Her HEENT exam reveals that she is normocephalic, atraumatic. Pupils are equal round and reactive to light sclera  are clear. Oropharynx is clear.. She has no JVD or bruits. No adenopathy or thyromegaly. Lungs are clear. Cardiac exam reveals a regular rate and rhythm without gallop or murmur. Abdomen is soft and nontender without masses or bruits. She has no edema. Pedal pulses are good. Skin is warm and dry. She is alert oriented x3. Cranial nerves II through XII are intact. LABORATORY DATA:   Assessment / Plan:

## 2011-07-17 NOTE — Assessment & Plan Note (Signed)
She had a single episode of atrial fibrillation in March. No recurrence to date. She is on anticoagulation with Coumadin. At this point we will continue to monitor. Previously she was significantly symptomatic with atrial fibrillation and if she has a recurrence would need to be initiated on antiarrhythmic drug therapy. Recent echocardiogram showed mild left ventricular dysfunction. She had mild mitral insufficiency and mild left atrial enlargement.

## 2011-07-17 NOTE — Assessment & Plan Note (Signed)
Blood pressure control is excellent today. 

## 2011-07-22 ENCOUNTER — Ambulatory Visit (INDEPENDENT_AMBULATORY_CARE_PROVIDER_SITE_OTHER): Payer: Medicare Other | Admitting: *Deleted

## 2011-07-22 DIAGNOSIS — Z7901 Long term (current) use of anticoagulants: Secondary | ICD-10-CM | POA: Diagnosis not present

## 2011-07-22 DIAGNOSIS — I4891 Unspecified atrial fibrillation: Secondary | ICD-10-CM | POA: Diagnosis not present

## 2011-07-22 LAB — POCT INR: INR: 1.9

## 2011-08-05 ENCOUNTER — Ambulatory Visit (INDEPENDENT_AMBULATORY_CARE_PROVIDER_SITE_OTHER): Payer: Medicare Other | Admitting: *Deleted

## 2011-08-05 DIAGNOSIS — Z7901 Long term (current) use of anticoagulants: Secondary | ICD-10-CM

## 2011-08-05 DIAGNOSIS — I4891 Unspecified atrial fibrillation: Secondary | ICD-10-CM

## 2011-08-19 ENCOUNTER — Ambulatory Visit (INDEPENDENT_AMBULATORY_CARE_PROVIDER_SITE_OTHER): Payer: Medicare Other | Admitting: Pharmacist

## 2011-08-19 DIAGNOSIS — I4891 Unspecified atrial fibrillation: Secondary | ICD-10-CM | POA: Diagnosis not present

## 2011-08-19 DIAGNOSIS — Z7901 Long term (current) use of anticoagulants: Secondary | ICD-10-CM

## 2011-08-19 LAB — POCT INR: INR: 2.4

## 2011-09-09 ENCOUNTER — Ambulatory Visit (INDEPENDENT_AMBULATORY_CARE_PROVIDER_SITE_OTHER): Payer: Medicare Other | Admitting: Pharmacist

## 2011-09-09 DIAGNOSIS — I4891 Unspecified atrial fibrillation: Secondary | ICD-10-CM | POA: Diagnosis not present

## 2011-09-09 DIAGNOSIS — Z7901 Long term (current) use of anticoagulants: Secondary | ICD-10-CM | POA: Diagnosis not present

## 2011-09-23 ENCOUNTER — Ambulatory Visit (INDEPENDENT_AMBULATORY_CARE_PROVIDER_SITE_OTHER): Payer: Medicare Other | Admitting: *Deleted

## 2011-09-23 DIAGNOSIS — I4891 Unspecified atrial fibrillation: Secondary | ICD-10-CM

## 2011-09-23 DIAGNOSIS — Z7901 Long term (current) use of anticoagulants: Secondary | ICD-10-CM | POA: Diagnosis not present

## 2011-09-23 LAB — POCT INR: INR: 1.9

## 2011-09-29 ENCOUNTER — Other Ambulatory Visit: Payer: Self-pay | Admitting: Cardiology

## 2011-09-30 ENCOUNTER — Other Ambulatory Visit: Payer: Self-pay | Admitting: Cardiology

## 2011-10-07 ENCOUNTER — Ambulatory Visit (INDEPENDENT_AMBULATORY_CARE_PROVIDER_SITE_OTHER): Payer: Medicare Other | Admitting: Pharmacist

## 2011-10-07 DIAGNOSIS — I4891 Unspecified atrial fibrillation: Secondary | ICD-10-CM

## 2011-10-07 DIAGNOSIS — Z7901 Long term (current) use of anticoagulants: Secondary | ICD-10-CM

## 2011-10-21 ENCOUNTER — Encounter: Payer: Self-pay | Admitting: Cardiology

## 2011-10-21 ENCOUNTER — Ambulatory Visit (INDEPENDENT_AMBULATORY_CARE_PROVIDER_SITE_OTHER): Payer: Medicare Other | Admitting: Cardiology

## 2011-10-21 ENCOUNTER — Ambulatory Visit (INDEPENDENT_AMBULATORY_CARE_PROVIDER_SITE_OTHER): Payer: Medicare Other | Admitting: Pharmacist

## 2011-10-21 VITALS — BP 130/68 | HR 56 | Ht 61.0 in | Wt 148.8 lb

## 2011-10-21 DIAGNOSIS — I2510 Atherosclerotic heart disease of native coronary artery without angina pectoris: Secondary | ICD-10-CM

## 2011-10-21 DIAGNOSIS — I4891 Unspecified atrial fibrillation: Secondary | ICD-10-CM

## 2011-10-21 DIAGNOSIS — Z7901 Long term (current) use of anticoagulants: Secondary | ICD-10-CM | POA: Diagnosis not present

## 2011-10-21 DIAGNOSIS — Z8679 Personal history of other diseases of the circulatory system: Secondary | ICD-10-CM

## 2011-10-21 NOTE — Progress Notes (Signed)
[name] Date of Birth: 16-Dec-1934   History of Present Illness: [name] is seen for followup today. She reports that she is doing well from a cardiac standpoint. She has had no recurrent palpitations. She denies any significant anginal pain. She's had no significant dizziness but sometimes states she just doesn't feel right in the head. She is limited by chronic back and leg pain. She also has arthritic pain in her hands. She has had spinal injections in the past with bearable results.  Current Outpatient Prescriptions on File Prior to Visit  Medication Sig Dispense Refill  . amLODipine (NORVASC) 5 MG tablet Take 5 mg by mouth daily.        [name] aspirin EC 81 MG tablet Take 1 tablet (81 mg total) by mouth daily.  150 tablet  2  . atorvastatin (LIPITOR) 40 MG tablet Take 1 tablet (40 mg total) by mouth daily.  90 tablet  3  . Calcium Carbonate (CALTRATE 600 PO) Take by mouth daily.        . isosorbide mononitrate (IMDUR) 60 MG 24 hr tablet TAKE 1 TABLET DAILY  90 tablet  3  . Lansoprazole (PREVACID PO) Take by mouth daily.        [name] losartan (COZAAR) 100 MG tablet Take 1 tablet (100 mg total) by mouth daily.  90 tablet  3  . metoprolol (LOPRESSOR) 50 MG tablet TAKE 1 TABLET TWICE A DAY  60 tablet  5  . metoprolol tartrate (LOPRESSOR) 25 MG tablet Take 25 mg by mouth 2 (two) times daily.        [name] 0.4 MG SL tablet Place 0.4 mg under the tongue every 5 (five) minutes as needed.       . Omega-3 Fatty Acids (FISH OIL) 1000 MG CAPS Take by mouth daily.        . ranolazine (RANEXA) 500 MG 12 hr tablet Take 1 tablet (500 mg total) by mouth 2 (two) times daily.  180 tablet  3  . warfarin (COUMADIN) 3 MG tablet Take as directed by anticoagulation clinic  30 tablet  3    Allergies  Allergen Reactions  . Demerol Nausea And Vomiting  . Sulfa Antibiotics Other (See Comments)    Causes weakness  . Niaspan (Niacin Er)     Past Medical History  Diagnosis Date  . Coronary artery  disease     STATUS POST CABG  . Hypertension   . Hyperlipidemia   . Anxiety   . DA (degenerative arthritis)   . Skin cancer   . Hearing loss   . Heartburn   . Atrial fibrillation     Past Surgical History  Procedure Date  . Cardiac catheterization 04/16/2007    NORMAL LEFT VENTRICULAR SIZE AND CONTRACTILITY WITH NORMAL SYSTOLIC FUNCTION. EF 60%. THERE IS MILD TO MODERATE MITRAL INSUFFICIENCY  . Cholecystectomy   . Nephrectomy     LEFT  . Tubal ligation   . Skin cancer excision   . Coronary artery bypass graft 1986    X3, LIMA GRAFT TO THE LAD. SHE HAS POOR TARGETS    History  Smoking status  . Never Smoker   Smokeless tobacco  . Not on file    History  Alcohol Use No    History reviewed. No pertinent family history.  Review of Systems: As noted in history of present illness.  All other systems were reviewed and are negative.  Physical Exam: BP 130/68  Pulse 56  Ht 5'  1" (1.549 m)  Wt 67.495 kg (148 lb 12.8 oz)  BMI 28.12 kg/m2  SpO2 95% She is a pleasant white female in no acute distress. Her HEENT exam reveals that she is normocephalic, atraumatic. Pupils are equal round and reactive to light sclera are clear. Oropharynx is clear.. She has no JVD or bruits. No adenopathy or thyromegaly. Lungs are clear. Cardiac exam reveals a regular rate and rhythm without gallop or murmur. Abdomen is soft and nontender without masses or bruits. She has no edema. Pedal pulses are good. Skin is warm and dry. She is alert oriented x3. Cranial nerves II through XII are intact.  LABORATORY DATA:   Assessment / Plan: 1. Coronary disease with stable class II angina. She is status post CABG in 1986. Prior cardiac catheterization in 2009 showed a patent LIMA to the LAD but her other grafts were occluded. She did not have any suitable vessels for PCI or redo bypass. She is on aggressive antianginal therapy including amlodipine, nitrates, metoprolol, and Ranexa. We will continue.  2.  Atrial fibrillation. She had a single episode in March of this year which has not recurred. She is on chronic anticoagulation.  3. Hypertension, well controlled.  4. Hyperlipidemia. She is on chronic Lipitor and fish oil. Followup lab work with her primary care.

## 2011-10-21 NOTE — Patient Instructions (Signed)
Continue your current medication.  I will see you again in 6 months.   

## 2011-10-27 DIAGNOSIS — R413 Other amnesia: Secondary | ICD-10-CM | POA: Diagnosis not present

## 2011-10-27 DIAGNOSIS — M79609 Pain in unspecified limb: Secondary | ICD-10-CM | POA: Diagnosis not present

## 2011-10-27 DIAGNOSIS — I4891 Unspecified atrial fibrillation: Secondary | ICD-10-CM | POA: Diagnosis not present

## 2011-10-27 DIAGNOSIS — M545 Low back pain: Secondary | ICD-10-CM | POA: Diagnosis not present

## 2011-11-04 ENCOUNTER — Ambulatory Visit (INDEPENDENT_AMBULATORY_CARE_PROVIDER_SITE_OTHER): Payer: Medicare Other | Admitting: *Deleted

## 2011-11-04 DIAGNOSIS — Z7901 Long term (current) use of anticoagulants: Secondary | ICD-10-CM

## 2011-11-04 DIAGNOSIS — I4891 Unspecified atrial fibrillation: Secondary | ICD-10-CM | POA: Diagnosis not present

## 2011-11-07 ENCOUNTER — Other Ambulatory Visit: Payer: Self-pay | Admitting: Cardiology

## 2011-11-18 ENCOUNTER — Ambulatory Visit (INDEPENDENT_AMBULATORY_CARE_PROVIDER_SITE_OTHER): Payer: Medicare Other | Admitting: *Deleted

## 2011-11-18 DIAGNOSIS — I4891 Unspecified atrial fibrillation: Secondary | ICD-10-CM

## 2011-11-18 DIAGNOSIS — Z7901 Long term (current) use of anticoagulants: Secondary | ICD-10-CM | POA: Diagnosis not present

## 2011-11-18 LAB — POCT INR: INR: 2.5

## 2011-12-04 ENCOUNTER — Telehealth: Payer: Self-pay | Admitting: *Deleted

## 2011-12-04 DIAGNOSIS — R918 Other nonspecific abnormal finding of lung field: Secondary | ICD-10-CM | POA: Diagnosis not present

## 2011-12-04 DIAGNOSIS — R05 Cough: Secondary | ICD-10-CM | POA: Diagnosis not present

## 2011-12-04 DIAGNOSIS — J189 Pneumonia, unspecified organism: Secondary | ICD-10-CM | POA: Diagnosis not present

## 2011-12-04 DIAGNOSIS — Z7901 Long term (current) use of anticoagulants: Secondary | ICD-10-CM | POA: Diagnosis not present

## 2011-12-04 NOTE — Telephone Encounter (Signed)
Patient called and stated she has been dx with pneumonia, she will start levaquin 500 mg 1xday today for 10 days, she has a f/u appt 12/09/2011 in coumadin clinic.

## 2011-12-09 ENCOUNTER — Ambulatory Visit (INDEPENDENT_AMBULATORY_CARE_PROVIDER_SITE_OTHER): Payer: Medicare Other

## 2011-12-09 DIAGNOSIS — I4891 Unspecified atrial fibrillation: Secondary | ICD-10-CM

## 2011-12-09 DIAGNOSIS — Z7901 Long term (current) use of anticoagulants: Secondary | ICD-10-CM

## 2011-12-09 LAB — POCT INR: INR: 3

## 2012-01-01 DIAGNOSIS — Z23 Encounter for immunization: Secondary | ICD-10-CM | POA: Diagnosis not present

## 2012-01-06 ENCOUNTER — Ambulatory Visit (INDEPENDENT_AMBULATORY_CARE_PROVIDER_SITE_OTHER): Payer: Medicare Other

## 2012-01-06 DIAGNOSIS — Z7901 Long term (current) use of anticoagulants: Secondary | ICD-10-CM | POA: Diagnosis not present

## 2012-01-06 DIAGNOSIS — I4891 Unspecified atrial fibrillation: Secondary | ICD-10-CM

## 2012-02-17 ENCOUNTER — Ambulatory Visit (INDEPENDENT_AMBULATORY_CARE_PROVIDER_SITE_OTHER): Payer: Medicare Other | Admitting: *Deleted

## 2012-02-17 DIAGNOSIS — I4891 Unspecified atrial fibrillation: Secondary | ICD-10-CM | POA: Diagnosis not present

## 2012-02-17 DIAGNOSIS — Z7901 Long term (current) use of anticoagulants: Secondary | ICD-10-CM | POA: Diagnosis not present

## 2012-03-02 ENCOUNTER — Ambulatory Visit (INDEPENDENT_AMBULATORY_CARE_PROVIDER_SITE_OTHER): Payer: Medicare Other | Admitting: *Deleted

## 2012-03-02 DIAGNOSIS — Z7901 Long term (current) use of anticoagulants: Secondary | ICD-10-CM | POA: Diagnosis not present

## 2012-03-02 DIAGNOSIS — I4891 Unspecified atrial fibrillation: Secondary | ICD-10-CM

## 2012-03-10 ENCOUNTER — Ambulatory Visit (INDEPENDENT_AMBULATORY_CARE_PROVIDER_SITE_OTHER): Payer: Medicare Other | Admitting: *Deleted

## 2012-03-10 DIAGNOSIS — I4891 Unspecified atrial fibrillation: Secondary | ICD-10-CM | POA: Diagnosis not present

## 2012-03-10 DIAGNOSIS — Z7901 Long term (current) use of anticoagulants: Secondary | ICD-10-CM | POA: Diagnosis not present

## 2012-03-10 LAB — POCT INR: INR: 2.6

## 2012-03-12 ENCOUNTER — Other Ambulatory Visit: Payer: Self-pay

## 2012-03-12 MED ORDER — ATORVASTATIN CALCIUM 40 MG PO TABS: 40.0000 mg | ORAL_TABLET | Freq: Every day | ORAL | Status: DC

## 2012-03-15 MED ORDER — WARFARIN SODIUM 3 MG PO TABS: ORAL_TABLET | ORAL | Status: DC

## 2012-03-23 ENCOUNTER — Ambulatory Visit (INDEPENDENT_AMBULATORY_CARE_PROVIDER_SITE_OTHER): Payer: Medicare Other | Admitting: *Deleted

## 2012-03-23 DIAGNOSIS — I4891 Unspecified atrial fibrillation: Secondary | ICD-10-CM | POA: Diagnosis not present

## 2012-03-23 DIAGNOSIS — Z7901 Long term (current) use of anticoagulants: Secondary | ICD-10-CM

## 2012-04-05 ENCOUNTER — Other Ambulatory Visit: Payer: Self-pay | Admitting: Cardiology

## 2012-04-13 ENCOUNTER — Ambulatory Visit (INDEPENDENT_AMBULATORY_CARE_PROVIDER_SITE_OTHER): Payer: Medicare Other | Admitting: *Deleted

## 2012-04-13 DIAGNOSIS — I4891 Unspecified atrial fibrillation: Secondary | ICD-10-CM | POA: Diagnosis not present

## 2012-04-13 DIAGNOSIS — Z7901 Long term (current) use of anticoagulants: Secondary | ICD-10-CM | POA: Diagnosis not present

## 2012-04-20 DIAGNOSIS — M25579 Pain in unspecified ankle and joints of unspecified foot: Secondary | ICD-10-CM | POA: Diagnosis not present

## 2012-04-20 DIAGNOSIS — M79609 Pain in unspecified limb: Secondary | ICD-10-CM | POA: Diagnosis not present

## 2012-04-29 ENCOUNTER — Ambulatory Visit (INDEPENDENT_AMBULATORY_CARE_PROVIDER_SITE_OTHER): Payer: Medicare Other | Admitting: Cardiology

## 2012-04-29 ENCOUNTER — Encounter: Payer: Self-pay | Admitting: Cardiology

## 2012-04-29 ENCOUNTER — Ambulatory Visit (INDEPENDENT_AMBULATORY_CARE_PROVIDER_SITE_OTHER): Payer: Medicare Other

## 2012-04-29 VITALS — BP 133/78 | HR 60 | Ht 61.0 in | Wt 147.4 lb

## 2012-04-29 DIAGNOSIS — I4891 Unspecified atrial fibrillation: Secondary | ICD-10-CM

## 2012-04-29 DIAGNOSIS — I2510 Atherosclerotic heart disease of native coronary artery without angina pectoris: Secondary | ICD-10-CM

## 2012-04-29 DIAGNOSIS — I10 Essential (primary) hypertension: Secondary | ICD-10-CM

## 2012-04-29 DIAGNOSIS — E785 Hyperlipidemia, unspecified: Secondary | ICD-10-CM | POA: Diagnosis not present

## 2012-04-29 DIAGNOSIS — Z7901 Long term (current) use of anticoagulants: Secondary | ICD-10-CM

## 2012-04-29 LAB — CBC WITH DIFFERENTIAL/PLATELET
Basophils Absolute: 0 10*3/uL (ref 0.0–0.1)
Eosinophils Relative: 2.5 % (ref 0.0–5.0)
HCT: 40 % (ref 36.0–46.0)
Hemoglobin: 13.7 g/dL (ref 12.0–15.0)
Lymphocytes Relative: 42.8 % (ref 12.0–46.0)
Monocytes Relative: 12.1 % — ABNORMAL HIGH (ref 3.0–12.0)
Neutro Abs: 3.4 10*3/uL (ref 1.4–7.7)
RBC: 3.92 Mil/uL (ref 3.87–5.11)
RDW: 13.8 % (ref 11.5–14.6)
WBC: 7.9 10*3/uL (ref 4.5–10.5)

## 2012-04-29 LAB — BASIC METABOLIC PANEL
CO2: 30 mEq/L (ref 19–32)
Chloride: 102 mEq/L (ref 96–112)
Creatinine, Ser: 0.9 mg/dL (ref 0.4–1.2)
Glucose, Bld: 95 mg/dL (ref 70–99)

## 2012-04-29 LAB — LIPID PANEL
HDL: 36.5 mg/dL — ABNORMAL LOW (ref >39.00)
Total CHOL/HDL Ratio: 4

## 2012-04-29 LAB — HEPATIC FUNCTION PANEL
ALT: 24 U/L (ref 0–35)
Albumin: 3.8 g/dL (ref 3.5–5.2)
Alkaline Phosphatase: 68 U/L (ref 39–117)
Bilirubin, Direct: 0.1 mg/dL (ref 0.0–0.3)
Total Protein: 7.7 g/dL (ref 6.0–8.3)

## 2012-04-29 LAB — POCT INR: INR: 3.1

## 2012-04-29 MED ORDER — ISOSORBIDE MONONITRATE ER 60 MG PO TB24: 60.0000 mg | ORAL_TABLET | Freq: Every day | ORAL | Status: DC

## 2012-04-29 MED ORDER — LOSARTAN POTASSIUM 100 MG PO TABS: 100.0000 mg | ORAL_TABLET | Freq: Every day | ORAL | Status: DC

## 2012-04-29 MED ORDER — METOPROLOL TARTRATE 50 MG PO TABS: 25.0000 mg | ORAL_TABLET | Freq: Two times a day (BID) | ORAL | Status: DC

## 2012-04-29 MED ORDER — AMLODIPINE BESYLATE 5 MG PO TABS: 5.0000 mg | ORAL_TABLET | Freq: Every day | ORAL | Status: DC

## 2012-04-29 MED ORDER — ATORVASTATIN CALCIUM 40 MG PO TABS: 40.0000 mg | ORAL_TABLET | Freq: Every day | ORAL | Status: DC

## 2012-04-29 NOTE — Patient Instructions (Signed)
We will check lab work today.  We will try and coordinate switching you to a newer blood thinner.  I will see you in 6 months.

## 2012-04-29 NOTE — Progress Notes (Signed)
[name] Date of Birth: [birth year]   History of Present Illness: [name] is seen for followup today. She reports that she is doing well from a cardiac standpoint. She has had no recurrent palpitations. She denies any significant anginal pain. She hasn't had to take nitroglycerin in over 2 months. She has chronic pain in her back and legs. She has had fluctuation in her Coumadin dosing.  Current Outpatient Prescriptions on File Prior to Visit  Medication Sig Dispense Refill  . aspirin EC 81 MG tablet Take 1 tablet (81 mg total) by mouth daily.  150 tablet  2  . Calcium Carbonate (CALTRATE 600 PO) Take by mouth daily.        . Lansoprazole (PREVACID PO) Take by mouth daily.        [name] 0.4 MG SL tablet Place 0.4 mg under the tongue every 5 (five) minutes as needed.       . Omega-3 Fatty Acids (FISH OIL) 1000 MG CAPS Take by mouth daily.        . ranolazine (RANEXA) 500 MG 12 hr tablet Take 1 tablet (500 mg total) by mouth 2 (two) times daily.  180 tablet  3  . warfarin (COUMADIN) 3 MG tablet TAKE 1 TABLET ONCE DAILY  30 tablet  3   No current facility-administered medications on file prior to visit.    Allergies  Allergen Reactions  . Demerol Nausea And Vomiting  . Sulfa Antibiotics Other (See Comments)    Causes weakness  . Niaspan (Niacin Er)     Past Medical History  Diagnosis Date  . Coronary artery disease     STATUS POST CABG  . Hypertension   . Hyperlipidemia   . Anxiety   . DA (degenerative arthritis)   . Skin cancer   . Hearing loss   . Heartburn   . Atrial fibrillation     Past Surgical History  Procedure Laterality Date  . Cardiac catheterization  04/16/2007    NORMAL LEFT VENTRICULAR SIZE AND CONTRACTILITY WITH NORMAL SYSTOLIC FUNCTION. EF 60%. THERE IS MILD TO MODERATE MITRAL INSUFFICIENCY  . Cholecystectomy    . Nephrectomy      LEFT  . Tubal ligation    . Skin cancer excision    . Coronary artery bypass graft  1986    X3, LIMA GRAFT TO  THE LAD. SHE HAS POOR TARGETS    History  Smoking status  . Never Smoker   Smokeless tobacco  . Not on file    History  Alcohol Use No    History reviewed. No pertinent family history.  Review of Systems: As noted in history of present illness.  All other systems were reviewed and are negative.  Physical Exam: BP 133/78  Pulse 60  Ht 5\' 1"  (1.549 m)  Wt 147 lb 6.4 oz (66.86 kg)  BMI 27.87 kg/m2  SpO2 92% She is a pleasant white female in no acute distress. Her HEENT exam reveals that she is normocephalic, atraumatic. Pupils are equal round and reactive to light sclera are clear. Oropharynx is clear.. She has no JVD or bruits. No adenopathy or thyromegaly. Lungs are clear. Cardiac exam reveals a regular rate and rhythm without gallop or murmur. Abdomen is soft and nontender without masses or bruits. She has no edema. Pedal pulses are good. Skin is warm and dry. She is alert oriented x3. Cranial nerves II through XII are intact.  LABORATORY DATA:   Assessment / Plan: 1. Coronary disease  with stable class II angina. She is status post CABG in 1986. Prior cardiac catheterization in 2009 showed a patent LIMA to the LAD but her other grafts were occluded. She did not have any suitable vessels for PCI or redo bypass. She is on aggressive antianginal therapy including amlodipine, nitrates, metoprolol, and Ranexa. We will continue.  2. Atrial fibrillation. She had a single episode in March of 2013 which has not recurred. She is on chronic anticoagulation with Coumadin. She is interested in switching to one of the novel agents. We will check with her pharmacy to see which one is least expensive. We will check lab work today including chemistries, CBC, and lipids. Coumadin clinic will transition her to one of the newer agents.  3. Hypertension, well controlled.  4. Hyperlipidemia. She is on chronic Lipitor and fish oil.

## 2012-05-04 ENCOUNTER — Other Ambulatory Visit: Payer: Self-pay | Admitting: Cardiology

## 2012-05-05 ENCOUNTER — Telehealth: Payer: Self-pay

## 2012-05-05 NOTE — Telephone Encounter (Signed)
Called pharmacy to see if they had the refill for losartan sent in on 04/29/12 for 90 tablets with 3 refills remaining because we got a refill request for this medication. Pharmacist stated their computer system is down and she will call back when the computers come back up to verify if they have the refill sent in on 22-/27/14 or not.

## 2012-05-18 ENCOUNTER — Ambulatory Visit (INDEPENDENT_AMBULATORY_CARE_PROVIDER_SITE_OTHER): Payer: Medicare Other | Admitting: Pharmacist

## 2012-05-18 DIAGNOSIS — I4891 Unspecified atrial fibrillation: Secondary | ICD-10-CM

## 2012-05-18 DIAGNOSIS — Z7901 Long term (current) use of anticoagulants: Secondary | ICD-10-CM

## 2012-05-21 ENCOUNTER — Ambulatory Visit (INDEPENDENT_AMBULATORY_CARE_PROVIDER_SITE_OTHER): Payer: Medicare Other | Admitting: Pharmacist

## 2012-05-21 DIAGNOSIS — I4891 Unspecified atrial fibrillation: Secondary | ICD-10-CM

## 2012-05-21 DIAGNOSIS — Z7901 Long term (current) use of anticoagulants: Secondary | ICD-10-CM | POA: Diagnosis not present

## 2012-05-21 LAB — POCT INR: INR: 2.2

## 2012-05-21 MED ORDER — APIXABAN 5 MG PO TABS: 5.0000 mg | ORAL_TABLET | Freq: Two times a day (BID) | ORAL | Status: DC

## 2012-06-07 ENCOUNTER — Other Ambulatory Visit: Payer: Self-pay | Admitting: Cardiology

## 2012-06-22 ENCOUNTER — Other Ambulatory Visit: Payer: Self-pay | Admitting: *Deleted

## 2012-06-22 ENCOUNTER — Encounter (INDEPENDENT_AMBULATORY_CARE_PROVIDER_SITE_OTHER): Payer: Medicare Other | Admitting: *Deleted

## 2012-06-22 ENCOUNTER — Ambulatory Visit (INDEPENDENT_AMBULATORY_CARE_PROVIDER_SITE_OTHER): Payer: Medicare Other | Admitting: Cardiology

## 2012-06-22 DIAGNOSIS — I4891 Unspecified atrial fibrillation: Secondary | ICD-10-CM | POA: Diagnosis not present

## 2012-06-22 DIAGNOSIS — Z7901 Long term (current) use of anticoagulants: Secondary | ICD-10-CM

## 2012-06-22 LAB — CBC WITH DIFFERENTIAL/PLATELET
Eosinophils Relative: 3.3 % (ref 0.0–5.0)
HCT: 39.7 % (ref 36.0–46.0)
Hemoglobin: 13.5 g/dL (ref 12.0–15.0)
Lymphs Abs: 2.9 10*3/uL (ref 0.7–4.0)
MCV: 103.6 fl — ABNORMAL HIGH (ref 78.0–100.0)
Monocytes Relative: 12.3 % — ABNORMAL HIGH (ref 3.0–12.0)
Neutro Abs: 2.7 10*3/uL (ref 1.4–7.7)
RDW: 13.8 % (ref 11.5–14.6)
WBC: 6.7 10*3/uL (ref 4.5–10.5)

## 2012-06-22 LAB — BASIC METABOLIC PANEL
CO2: 31 mEq/L (ref 19–32)
Calcium: 9.3 mg/dL (ref 8.4–10.5)
Creatinine, Ser: 0.7 mg/dL (ref 0.4–1.2)
Glucose, Bld: 96 mg/dL (ref 70–99)

## 2012-06-22 NOTE — Progress Notes (Signed)
DOSE eliquis 5 mg Twice daily A small bruise on right side ADVERSE SIDE EFFECT REVIEW MEDICATION INTERACTIONS NO DIET RESTRICTIONS VS QUESTIONS AND ANSWERS bmet and cbc ordered Patient states she only has 1 kidney  This encounter was created in error - please disregard.

## 2012-06-22 NOTE — Progress Notes (Signed)
eliquis 5 mg bid ADVERSE SIDE EFFECT REVIEW--no side effects MEDICATION INTERACTIONS--none NO DIET RESTRICTIONS--reviewed QUESTIONS AND ANSWERS--none Will recheck labs in 6 months Bmet and cbc obtained today  4/22---hgb 13.5 hct 39.7 4/22---[place] 0.7  4:49 today I called pt regarding her H&H and [place] being wnl

## 2012-06-28 ENCOUNTER — Encounter: Payer: Self-pay | Admitting: Obstetrics & Gynecology

## 2012-06-28 ENCOUNTER — Telehealth: Payer: Self-pay | Admitting: Obstetrics & Gynecology

## 2012-06-28 NOTE — Telephone Encounter (Signed)
Patient scheduled with Dr. Hyacinth Meeker on Tuesday 06/29/12 @ 2:30.

## 2012-06-28 NOTE — Telephone Encounter (Signed)
Patient needs topical ointment for fungal infection in groin.  Also needs medication for bladder infection.  Has not been seen since 03/29/09.

## 2012-06-28 NOTE — Telephone Encounter (Signed)
Needs OV if thinks she has a UTI.

## 2012-06-29 ENCOUNTER — Ambulatory Visit (INDEPENDENT_AMBULATORY_CARE_PROVIDER_SITE_OTHER): Payer: Medicare Other | Admitting: Obstetrics & Gynecology

## 2012-06-29 ENCOUNTER — Encounter: Payer: Self-pay | Admitting: Obstetrics & Gynecology

## 2012-06-29 VITALS — BP 122/78 | HR 60 | Temp 96.0°F | Resp 25

## 2012-06-29 DIAGNOSIS — N390 Urinary tract infection, site not specified: Secondary | ICD-10-CM | POA: Diagnosis not present

## 2012-06-29 DIAGNOSIS — B372 Candidiasis of skin and nail: Secondary | ICD-10-CM

## 2012-06-29 LAB — POCT URINALYSIS DIPSTICK
Glucose, UA: NEGATIVE
Nitrite, UA: POSITIVE
Urobilinogen, UA: NEGATIVE

## 2012-06-29 MED ORDER — NYSTATIN 100000 UNIT/GM EX CREA: TOPICAL_CREAM | Freq: Two times a day (BID) | CUTANEOUS | Status: DC

## 2012-06-29 MED ORDER — CEPHALEXIN 250 MG PO CAPS: 250.0000 mg | ORAL_CAPSULE | Freq: Four times a day (QID) | ORAL | Status: DC

## 2012-06-29 NOTE — Patient Instructions (Signed)
We will call you when the culture is back so you know you are on the correct antibiotic.  Please call back if your symptoms are not better in two days or if you being to run a fever.

## 2012-06-29 NOTE — Progress Notes (Signed)
S:  78 y.o.Married Caucasian female presents with UTI symptoms of dysuria. Symptoms started on Thursday.  She took OTC pyridium but symptoms did not improve.  She has not had a UTI in over three years.  No fever.  She has constant back pain but it is not worse.  No hematuria.  No vaginal bleeding.  No vaginal discharge.  Pt is PMP.  Last UTI was in 2011 and culture showed multiple drug resistances.    ROS: no weight loss, fever, night sweats and no chills  O Gen:  alert, oriented to person, place, and time   Flank:  No CVA tenderness  Abd:  Soft, NT, ND, normal BS, No masses, no suprapubic tenderness    Diagnostic Test:    Urinalysis wbc- 10-25, rbc- 0-5, nitrites+, tr protein   urine culture  Assessment:  UTI   Plan:  Keflex 250 mg qid x 7 days.   Urine culture Return for TOC after culture is back in about two weeks Rx for Nystatin cream given as pt uses Prn for skin yeast.  Patient is given AVS

## 2012-06-30 LAB — URINALYSIS, MICROSCOPIC ONLY: Crystals: NONE SEEN

## 2012-07-02 ENCOUNTER — Telehealth: Payer: Self-pay | Admitting: *Deleted

## 2012-07-02 ENCOUNTER — Other Ambulatory Visit: Payer: Self-pay | Admitting: Obstetrics & Gynecology

## 2012-07-02 DIAGNOSIS — N390 Urinary tract infection, site not specified: Secondary | ICD-10-CM

## 2012-07-02 LAB — URINE CULTURE: Colony Count: >=100000

## 2012-07-02 NOTE — Telephone Encounter (Signed)
Message copied by Alisa Graff on Fri Jul 02, 2012  4:09 PM ------      Message from: Jerene Bears      Created: Fri Jul 02, 2012  1:36 PM       Please call pt.  Cx positive for e coli.  H/o multiple drug resistant UTI.  She is on Keflex.  E coli sensitive to this abx.  She needs to finish abx and have urine culture TOC two weeks.  Order entered in [place]. ------

## 2012-07-05 NOTE — Telephone Encounter (Signed)
Patient notified of urine culture results and stressed the need for completion of medication.  Patient states she is feeling much better and will complete.  She is leaving for the beach tomorrow and will be gone for a week and will then call for lab appointment to have urine rechecked.  Stressed again the importance of completing meds and having follow up culture to assure resolution. Agreeable.

## 2012-07-05 NOTE — Telephone Encounter (Signed)
Returning Sally's call. °

## 2012-07-05 NOTE — Telephone Encounter (Signed)
Patient returning Sally's call. °

## 2012-07-21 ENCOUNTER — Ambulatory Visit (INDEPENDENT_AMBULATORY_CARE_PROVIDER_SITE_OTHER): Payer: Medicare Other | Admitting: Obstetrics and Gynecology

## 2012-07-21 VITALS — BP 132/66 | HR 74 | Resp 12 | Wt 144.8 lb

## 2012-07-21 DIAGNOSIS — N390 Urinary tract infection, site not specified: Secondary | ICD-10-CM | POA: Diagnosis not present

## 2012-07-25 ENCOUNTER — Other Ambulatory Visit: Payer: Self-pay | Admitting: Obstetrics & Gynecology

## 2012-07-25 ENCOUNTER — Encounter: Payer: Self-pay | Admitting: Obstetrics and Gynecology

## 2012-07-25 MED ORDER — CIPROFLOXACIN HCL 500 MG PO TABS: 500.0000 mg | ORAL_TABLET | Freq: Two times a day (BID) | ORAL | Status: DC

## 2012-07-25 NOTE — Progress Notes (Signed)
See phone note from same date.

## 2012-07-25 NOTE — Progress Notes (Signed)
Called pt to inform urine culture still positive  She states still having burning with urination.  ciprofloxin 500mg  bid x7 days to [facility] [address] in [place].  Will set up test of cure about two weeks.

## 2012-07-29 MED ORDER — AMOXICILLIN-POT CLAVULANATE 875-125 MG PO TABS: 1.0000 | ORAL_TABLET | Freq: Two times a day (BID) | ORAL | Status: DC

## 2012-07-29 NOTE — Addendum Note (Signed)
Addended by: Jerene Bears on: 07/29/2012 10:43 AM   Modules accepted: Orders

## 2012-08-02 ENCOUNTER — Telehealth: Payer: Self-pay | Admitting: Cardiology

## 2012-08-02 NOTE — Telephone Encounter (Signed)
New Problem  Pt states she is suppose to schedule an appt for lab work in August. There are no orders in the system.

## 2012-08-02 NOTE — Telephone Encounter (Signed)
Returned call to patient she stated she was started on eliquis and was told by coumadin clinic she will need to have lab work done every 6 months.Will check with coumadin clinic for lab order.

## 2012-08-03 NOTE — Telephone Encounter (Signed)
Spoke with pt and made her an appt for December 22, 2012 to have her 6 months check up as she is on Eliquis and pt states understanding.

## 2012-08-16 ENCOUNTER — Telehealth: Payer: Self-pay | Admitting: Obstetrics & Gynecology

## 2012-08-16 NOTE — Telephone Encounter (Signed)
Call to patient to follow up on poss UTI.  Patient reports has been treated back in May

## 2012-08-16 NOTE — Telephone Encounter (Signed)
Patient is coming in on the 19th for labs .She would like to be seen before that for a possible UTI . However we have no open slots for OV's.Please call to see if we can get her in for this problem.

## 2012-08-16 NOTE — Telephone Encounter (Signed)
Called patient to make appt. For her. Stated only wanted to see Dr. Hyacinth Meeker. Having symptoms of possible UTI, stated feeling a lot of pressure in her bladder and was not able to sit down much. Explained needed to be seen today but says she has another appt. Today and can not cancel it. Offered appt. With Dr. Edward Jolly but stated could not come. Appointment made with Dr. Hyacinth Meeker for June 19th @ 8:30am. Patient was instructed to call back if changed her plans or need to be seen by anouther provider.

## 2012-08-16 NOTE — Telephone Encounter (Signed)
(  Cont from prev note) Was treated 07-21-12 for pos urine culture, E.Coli and Klebsiella, and symptoms improved but are returning.  Reports some discomfort with voiding and vaginal pressure as if something pushing down.  Denies fever.  Reports some frequency, but not uncommon for her.  Also some back pain, but again, thinks may be relate to other issues.  States symptoms felt worse this am and are somewhat better at present.  Instructed will review this with dr Hyacinth Meeker and see about ability to work patient in tomm at 50.  She is aware Dr Hyacinth Meeker has surgery tomm and is not suppose to be in office but patient still declines to see another provider.  She knows I may have to reschedule this appointment.  Please advise.

## 2012-08-16 NOTE — Telephone Encounter (Signed)
Please put on schedule for Tuesday.

## 2012-08-17 ENCOUNTER — Encounter: Payer: Self-pay | Admitting: Obstetrics & Gynecology

## 2012-08-17 ENCOUNTER — Ambulatory Visit (INDEPENDENT_AMBULATORY_CARE_PROVIDER_SITE_OTHER): Payer: Medicare Other | Admitting: Nurse Practitioner

## 2012-08-17 VITALS — BP 108/60 | HR 60 | Ht 61.0 in | Wt 145.0 lb

## 2012-08-17 DIAGNOSIS — R300 Dysuria: Secondary | ICD-10-CM | POA: Diagnosis not present

## 2012-08-17 DIAGNOSIS — N390 Urinary tract infection, site not specified: Secondary | ICD-10-CM

## 2012-08-17 LAB — POCT URINALYSIS DIPSTICK
Bilirubin, UA: NEGATIVE
Glucose, UA: NEGATIVE
Ketones, UA: NEGATIVE

## 2012-08-17 MED ORDER — CEPHALEXIN 250 MG PO CAPS: 250.0000 mg | ORAL_CAPSULE | Freq: Four times a day (QID) | ORAL | Status: DC

## 2012-08-17 NOTE — Progress Notes (Signed)
Subjective:     Patient ID: [name], female   DOB: November 22, 1934, 78 y.o.   MRN: [id number]  HPI  This 78 yo WMF presents with urinary pressure, urgency and burning for about a week. States she has a fullness in the pubic area making it hard to sit for very long. She denies fever or chills.  No flank pain. She has only one kidney on the right. No vaginal discharge. She has to wear pads daily for SUI. She does not believe this symptoms are related to sexual activity however last sexually active about 3 weeks ago. Since April has 2 infections.  She has history of multiple drug allergies and problems with high doses of antibiotics causing nausea.  Review of Systems  Constitutional: Negative for fever, chills, diaphoresis, appetite change and fatigue.  Respiratory: Negative.  Negative for shortness of breath.   Cardiovascular: Negative.  Negative for chest pain.       History of angina - but no pain episodes recently.  Gastrointestinal: Positive for abdominal pain and constipation. Negative for nausea, vomiting, diarrhea, abdominal distention and rectal pain.       Lower pelvic pressure.  History of constipaton.  Endocrine: Negative for polydipsia, polyphagia and polyuria.  Genitourinary: Positive for dysuria, urgency, frequency, difficulty urinating and pelvic pain. Negative for hematuria, flank pain, decreased urine volume, vaginal bleeding, vaginal discharge, genital sores and dyspareunia.  Musculoskeletal: Negative for back pain.  Neurological: Negative for dizziness, weakness, light-headedness and headaches.  Psychiatric/Behavioral: Negative for behavioral problems, confusion and agitation. The patient is not nervous/anxious.         Objective:   Physical Exam  Vitals reviewed. Constitutional: She is oriented to person, place, and time. She appears well-developed and well-nourished.  Cardiovascular: Normal rate.   Pulmonary/Chest: Effort normal.  Abdominal: Soft. She exhibits no  distension and no mass. There is no tenderness. There is no rebound and no guarding.  Genitourinary:  Sterile In & Out cath urine specimen since patient unabe to void. Urine culture sent to lab. Total of urine obtained about 15 cc. Pelvic relaxation is noted with bulging out the introitus. No vaginal dischagrge. Grade 3 cystocele while standing.  Neurological: She is alert and oriented to person, place, and time.  Skin: Skin is warm and dry.  Psychiatric: She has a normal mood and affect. Her behavior is normal. Judgment and thought content normal.       Assessment:     R/O UTI - history of same - now times 3 since first of year per patient. S/P left Nephrectomy secondary to Hypertension 1960's     Plan:     Since patient has multiple problems with antibiotic therapy, restarted on Keflex 250 mg QID for 10 days instead of 7.   Call her with urine C & S results Will do TOC in 3 weeks. Discussed and showed patient the types of Pessaries in the  box and answered questions abut being able to be sexually active. Other treatment options may be surgically related. When she returns for [facility] will see Dr. Hyacinth Meeker and discuss what is best for her.

## 2012-08-17 NOTE — Patient Instructions (Signed)
Urinary Tract Infection  Urinary tract infections (UTIs) can develop anywhere along your urinary tract. Your urinary tract is your body's drainage system for removing wastes and extra water. Your urinary tract includes two kidneys, two ureters, a bladder, and a urethra. Your kidneys are a pair of bean-shaped organs. Each kidney is about the size of your fist. They are located below your ribs, one on each side of your spine.  CAUSES  Infections are caused by microbes, which are microscopic organisms, including fungi, viruses, and bacteria. These organisms are so small that they can only be seen through a microscope. Bacteria are the microbes that most commonly cause UTIs.  SYMPTOMS   Symptoms of UTIs may vary by age and gender of the patient and by the location of the infection. Symptoms in young women typically include a frequent and intense urge to urinate and a painful, burning feeling in the bladder or urethra during urination. Older women and men are more likely to be tired, shaky, and weak and have muscle aches and abdominal pain. A fever may mean the infection is in your kidneys. Other symptoms of a kidney infection include pain in your back or sides below the ribs, nausea, and vomiting.  DIAGNOSIS  To diagnose a UTI, your caregiver will ask you about your symptoms. Your caregiver also will ask to provide a urine sample. The urine sample will be tested for bacteria and white blood cells. White blood cells are made by your body to help fight infection.  TREATMENT   Typically, UTIs can be treated with medication. Because most UTIs are caused by a bacterial infection, they usually can be treated with the use of antibiotics. The choice of antibiotic and length of treatment depend on your symptoms and the type of bacteria causing your infection.  HOME CARE INSTRUCTIONS   If you were prescribed antibiotics, take them exactly as your caregiver instructs you. Finish the medication even if you feel better after you  have only taken some of the medication.   Drink enough water and fluids to keep your urine clear or pale yellow.   Avoid caffeine, tea, and carbonated beverages. They tend to irritate your bladder.   Empty your bladder often. Avoid holding urine for long periods of time.   Empty your bladder before and after sexual intercourse.   After a bowel movement, women should cleanse from front to back. Use each tissue only once.  SEEK MEDICAL CARE IF:    You have back pain.   You develop a fever.   Your symptoms do not begin to resolve within 3 days.  SEEK IMMEDIATE MEDICAL CARE IF:    You have severe back pain or lower abdominal pain.   You develop chills.   You have nausea or vomiting.   You have continued burning or discomfort with urination.  MAKE SURE YOU:    Understand these instructions.   Will watch your condition.   Will get help right away if you are not doing well or get worse.  Document Released: 11/27/2004 Document Revised: 08/19/2011 Document Reviewed: 03/28/2011  [employer] Patient Information 2014 [employer].

## 2012-08-17 NOTE — Progress Notes (Signed)
Reviewed personally.  M. Suzanne Davionne Mastrangelo, MD.  

## 2012-08-17 NOTE — Telephone Encounter (Signed)
Patient notified that Dr Hyacinth Meeker will see her today at 17.

## 2012-08-19 ENCOUNTER — Ambulatory Visit: Payer: Medicare Other | Admitting: Obstetrics & Gynecology

## 2012-08-19 ENCOUNTER — Other Ambulatory Visit: Payer: Medicare Other

## 2012-08-19 LAB — URINE CULTURE: Organism ID, Bacteria: NO GROWTH

## 2012-08-23 ENCOUNTER — Telehealth: Payer: Self-pay | Admitting: Orthopedic Surgery

## 2012-08-23 NOTE — Telephone Encounter (Signed)
Spoke with pt to inform her that her urine culture did not show any growth. Pt appreciative.

## 2012-08-23 NOTE — Telephone Encounter (Signed)
Message copied by Alfredo Batty on Mon Aug 23, 2012  9:42 AM ------      Message from: Conley Simmonds      Created: Fri Aug 20, 2012  6:43 AM       Please inform of negative urine culture results. ------

## 2012-09-01 ENCOUNTER — Ambulatory Visit (INDEPENDENT_AMBULATORY_CARE_PROVIDER_SITE_OTHER): Payer: Medicare Other | Admitting: Obstetrics & Gynecology

## 2012-09-01 ENCOUNTER — Encounter: Payer: Self-pay | Admitting: Obstetrics & Gynecology

## 2012-09-01 VITALS — BP 128/64 | HR 60 | Resp 16 | Ht 59.5 in | Wt 144.6 lb

## 2012-09-01 DIAGNOSIS — N390 Urinary tract infection, site not specified: Secondary | ICD-10-CM

## 2012-09-01 LAB — POCT URINALYSIS DIPSTICK
Bilirubin, UA: NEGATIVE
Glucose, UA: NEGATIVE
Ketones, UA: NEGATIVE
Nitrite, UA: POSITIVE

## 2012-09-01 MED ORDER — CIPROFLOXACIN HCL 500 MG PO TABS: 500.0000 mg | ORAL_TABLET | Freq: Two times a day (BID) | ORAL | Status: DC

## 2012-09-01 NOTE — Patient Instructions (Signed)
Call if your symptoms worsen

## 2012-09-01 NOTE — Progress Notes (Signed)
Subjective:     Patient ID: [name], female   DOB: [birth year], 78 y.o.   MRN: [id number]  Urinary Tract Infection    78 year old MWF with recurrent UTIs here for third time this year.  Has +urine culture 4/14 and 5/14 with E.Coli and Klebsiella positive cultures.  Has had multiple drug resistant UTI's in past.  Has been treated with same antibiotic this year--Keflex--but sensitivities have shown could be treated with quinolones.  No fever.  Having dysuria and pressure.  No back pain.    Review of Systems  All other systems reviewed and are negative.       Objective:   Physical Exam  Constitutional: She is oriented to person, place, and time. She appears well-developed and well-nourished.  HENT:  Head: Normocephalic and atraumatic.  Abdominal: Soft. Bowel sounds are normal. She exhibits no distension (with palpation of suprapubic area, patient feels increasing bladder pressure) and no mass. There is no tenderness. There is no rebound and no guarding.  Genitourinary: Vagina normal and uterus normal. Guaiac stool: atrophic changes, no discharge, no masses on palpation, 3rd degree cystocele.  Neurological: She is alert and oriented to person, place, and time.  Skin: Skin is warm and dry.  Psychiatric: She has a normal mood and affect.       Assessment:     Recurrent UTI     Plan:     Ciprofloxin 500mg  bid x 10 day Urine culture pending Will start trimethoprim after treatment course Patient declines using pessary again Reluctantly agrees to see urologist

## 2012-09-03 LAB — URINE CULTURE

## 2012-09-04 ENCOUNTER — Other Ambulatory Visit: Payer: Self-pay | Admitting: Obstetrics & Gynecology

## 2012-09-04 DIAGNOSIS — N390 Urinary tract infection, site not specified: Secondary | ICD-10-CM

## 2012-09-04 MED ORDER — TRIMETHOPRIM 100 MG PO TABS: 100.0000 mg | ORAL_TABLET | Freq: Every day | ORAL | Status: DC

## 2012-09-09 ENCOUNTER — Telehealth: Payer: Self-pay | Admitting: Orthopedic Surgery

## 2012-09-09 NOTE — Telephone Encounter (Signed)
Spoke with pt about appt at [facility] with Dr. Sherron Monday 10-11-12 at 12:45. Address given.

## 2012-09-10 ENCOUNTER — Telehealth: Payer: Self-pay | Admitting: *Deleted

## 2012-09-10 NOTE — Telephone Encounter (Signed)
[facility] request notes and lab result of patient for appointment scheduled. Faxed to [facility] to [facility].

## 2012-09-22 ENCOUNTER — Telehealth: Payer: Self-pay | Admitting: Cardiology

## 2012-09-22 NOTE — Telephone Encounter (Signed)
New Prob  Pt would like to speak with you regarding her meds.

## 2012-09-22 NOTE — Telephone Encounter (Signed)
Returned call to patient samples of eliquis 5 mg and ranexa 500 mg left at 3rd floor front desk.

## 2012-09-23 ENCOUNTER — Telehealth: Payer: Self-pay | Admitting: *Deleted

## 2012-09-23 ENCOUNTER — Encounter: Payer: Self-pay | Admitting: *Deleted

## 2012-09-23 ENCOUNTER — Ambulatory Visit (INDEPENDENT_AMBULATORY_CARE_PROVIDER_SITE_OTHER): Payer: Medicare Other | Admitting: *Deleted

## 2012-09-23 VITALS — BP 116/80 | HR 60 | Ht 59.5 in | Wt 143.0 lb

## 2012-09-23 DIAGNOSIS — N390 Urinary tract infection, site not specified: Secondary | ICD-10-CM

## 2012-09-23 NOTE — Telephone Encounter (Signed)
[name] came to the office today for urine TOC. Culture sent.  [name] would like to know if this culure comes back clear would you still like her to go see the urologist.  Please advise.  Pt is OK waiting for answer when we call with culture results.

## 2012-09-23 NOTE — Progress Notes (Signed)
Pt presented to office for urine TOC.  Pt completed Cipro course and began Trimethoprim daily.  Cipro removed form med list.  She is not having any symptoms.  Urine culture sent.

## 2012-09-26 LAB — URINE CULTURE

## 2012-09-28 ENCOUNTER — Telehealth: Payer: Self-pay | Admitting: *Deleted

## 2012-10-08 ENCOUNTER — Encounter: Payer: Self-pay | Admitting: Cardiology

## 2012-10-08 ENCOUNTER — Ambulatory Visit (INDEPENDENT_AMBULATORY_CARE_PROVIDER_SITE_OTHER): Payer: Medicare Other | Admitting: Cardiology

## 2012-10-08 ENCOUNTER — Other Ambulatory Visit: Payer: Self-pay

## 2012-10-08 VITALS — BP 120/74 | HR 63 | Ht 59.5 in | Wt 144.8 lb

## 2012-10-08 DIAGNOSIS — E785 Hyperlipidemia, unspecified: Secondary | ICD-10-CM

## 2012-10-08 DIAGNOSIS — I10 Essential (primary) hypertension: Secondary | ICD-10-CM

## 2012-10-08 DIAGNOSIS — I2510 Atherosclerotic heart disease of native coronary artery without angina pectoris: Secondary | ICD-10-CM

## 2012-10-08 DIAGNOSIS — I4891 Unspecified atrial fibrillation: Secondary | ICD-10-CM | POA: Diagnosis not present

## 2012-10-08 NOTE — Patient Instructions (Signed)
Stop ASA.  You may come off Eliquis for an epidural injection.  Continue your other therapy  I will see you in 6 months with fasting lab work

## 2012-10-08 NOTE — Progress Notes (Signed)
[name] Date of Birth: [birth year]   History of Present Illness: [name] is seen for followup today. She has a history of coronary disease status post CABG in 1986. Cardiac catheterization 2009 showed the LIMA graft to LAD was patent but her other grafts were occluded. She had no suitable vessels for PCI or bypass. She did have an episode of atrial fibrillation March 2013 and has been on chronic anticoagulation. She reports that she is doing well from a cardiac standpoint. She has had no recurrent palpitations. She denies any significant anginal pain. She continues to have problems with her back and legs and is interested in having another epidural injection.  Current Outpatient Prescriptions on File Prior to Visit  Medication Sig Dispense Refill  . amLODipine (NORVASC) 5 MG tablet Take 1 tablet (5 mg total) by mouth daily.  90 tablet  3  . apixaban (ELIQUIS) 5 MG TABS tablet Take 1 tablet (5 mg total) by mouth 2 (two) times daily.  60 tablet  3  . aspirin 81 MG tablet Take 81 mg by mouth daily.      [name] atorvastatin (LIPITOR) 40 MG tablet Take 1 tablet (40 mg total) by mouth daily.  90 tablet  3  . Calcium-Vitamin D (CALTRATE 600 PLUS-VIT D PO) Take by mouth daily.      . isosorbide mononitrate (IMDUR) 60 MG 24 hr tablet Take 1 tablet (60 mg total) by mouth daily.  90 tablet  3  . Lansoprazole (PREVACID PO) Take by mouth daily.        [name] losartan (COZAAR) 100 MG tablet Take 1 tablet (100 mg total) by mouth daily.  90 tablet  3  . metoprolol (LOPRESSOR) 50 MG tablet Take 0.5 tablets (25 mg total) by mouth 2 (two) times daily.  90 tablet  1  . NITROSTAT 0.4 MG SL tablet Place 0.4 mg under the tongue every 5 (five) minutes as needed.       . nystatin cream (MYCOSTATIN) Apply topically 2 (two) times daily. Apply to affected area BID for up to 7 days.  30 g  1  . Omega-3 Fatty Acids (FISH OIL) 1000 MG CAPS Take by mouth daily.        [name] RANEXA 500 MG 12 hr tablet TAKE 1 TABLET 2 TIMES A DAY   180 tablet  2  . trimethoprim (TRIMPEX) 100 MG tablet Take 1 tablet (100 mg total) by mouth daily.  30 tablet  3   No current facility-administered medications on file prior to visit.    Allergies  Allergen Reactions  . Demerol Nausea And Vomiting  . Sulfa Antibiotics Other (See Comments)    Causes weakness  . Macrodantin (Nitrofurantoin)   . Niaspan (Niacin Er)   . Tape     Past Medical History  Diagnosis Date  . Coronary artery disease     STATUS POST CABG  . Hypertension   . Hyperlipidemia   . Anxiety   . DA (degenerative arthritis)   . Skin cancer   . Hearing loss   . Heartburn   . Atrial fibrillation   . Ovarian cyst     h/o complex right ovarian cyst/followed yearly    Past Surgical History  Procedure Laterality Date  . Cardiac catheterization  04/16/2007    NORMAL LEFT VENTRICULAR SIZE AND CONTRACTILITY WITH NORMAL SYSTOLIC FUNCTION. EF 60%. THERE IS MILD TO MODERATE MITRAL INSUFFICIENCY  . Cholecystectomy    . Nephrectomy      LEFT  .  Tubal ligation    . Skin cancer excision    . Coronary artery bypass graft  1986    X3, LIMA GRAFT TO THE LAD. SHE HAS POOR TARGETS  . Tonsillectomy    . Hysteroscopy  8/04    polyp resection    History  Smoking status  . Never Smoker   Smokeless tobacco  . Not on file    History  Alcohol Use No    Family History  Problem Relation Age of Onset  . Hypertension Father   . Breast cancer Sister     Review of Systems: As noted in history of present illness.  All other systems were reviewed and are negative.  Physical Exam: BP 120/74  Pulse 63  Ht 4' 11.5" (1.511 m)  Wt 144 lb 12.8 oz (65.681 kg)  BMI 28.77 kg/m2  LMP 03/03/1992 She is a pleasant white female in no acute distress. Her HEENT exam reveals that she is normocephalic, atraumatic. Pupils are equal round and reactive to light sclera are clear. Oropharynx is clear. She has no JVD or bruits. No adenopathy or thyromegaly. Lungs are clear. Cardiac exam  reveals a regular rate and rhythm without gallop or murmur. Abdomen is soft and nontender without masses or bruits. She has no edema. Pedal pulses are good. Skin is warm and dry. She is alert oriented x3. Cranial nerves II through XII are intact.  LABORATORY DATA: Lab Results  Component Value Date   WBC 6.7 06/22/2012   HGB 13.5 06/22/2012   HCT 39.7 06/22/2012   PLT 135.0* 06/22/2012   GLUCOSE 96 06/22/2012   CHOL 164 04/29/2012   TRIG 186.0* 04/29/2012   HDL 36.50* 04/29/2012   LDLCALC 90 04/29/2012   ALT 24 04/29/2012   AST 23 04/29/2012   NA 137 06/22/2012   K 3.8 06/22/2012   CL 102 06/22/2012   CREATININE 0.7 06/22/2012   BUN 11 06/22/2012   CO2 31 06/22/2012   INR 2.2 05/21/2012   HGBA1C  Value: 5.6 (NOTE)   The ADA recommends the following therapeutic goals for glycemic   control related to Hgb A1C measurement:   Goal of Therapy:   < 7.0% Hgb A1C   Action Suggested:  > 8.0% Hgb A1C   Ref:  Diabetes Care, 22, Suppl. 1, 1999 04/16/2007   ECG today demonstrates normal sinus rhythm with a nonspecific T-wave abnormality.  Assessment / Plan: 1. Coronary disease with stable class II angina. She is status post CABG in 1986. Prior cardiac catheterization in 2009 showed a patent LIMA to the LAD but her other grafts were occluded. She did not have any suitable vessels for PCI or redo bypass. She is on aggressive antianginal therapy including amlodipine, nitrates, metoprolol, and Ranexa. We will continue.  2. Atrial fibrillation. She had a single episode in March of 2013 which has not recurred. She is on chronic anticoagulation with Eliquis. She remains in sinus rhythm today. I have okayed her to come off of Eliquis in order to have an epidural injection.  3. Hypertension, well controlled.  4. Hyperlipidemia. She is on chronic Lipitor and fish oil.

## 2012-10-11 DIAGNOSIS — N3020 Other chronic cystitis without hematuria: Secondary | ICD-10-CM | POA: Diagnosis not present

## 2012-10-11 DIAGNOSIS — N3946 Mixed incontinence: Secondary | ICD-10-CM | POA: Diagnosis not present

## 2012-10-13 NOTE — Telephone Encounter (Signed)
Per lab result note, pt needs to keep appt with urologist and pt is aware.

## 2012-11-02 ENCOUNTER — Telehealth: Payer: Self-pay | Admitting: Cardiology

## 2012-11-02 NOTE — Telephone Encounter (Signed)
New Prob ° ° ° °Pt has some questions regarding her medications. Please call. °

## 2012-11-02 NOTE — Telephone Encounter (Signed)
Returned call to patient she stated she is in the doughnut hole and needs samples of ranexa 500 mg and eliquis 5 mg.Samples of eliquis 5 mg left at 3rd floor front desk,office out of ranexa 500 mg.

## 2012-11-04 NOTE — Telephone Encounter (Signed)
Follow Up    Pt states she will come pick up her samples tomorrow.

## 2012-11-05 NOTE — Telephone Encounter (Signed)
Patient came by office to pick up eliquis samples.Ranexa Connect patient assistance program form completed, signed by patient and Dr.Jordan.Form with patient's tax return form faxed to [phone].

## 2012-11-10 ENCOUNTER — Other Ambulatory Visit: Payer: Self-pay | Admitting: Cardiology

## 2012-11-19 ENCOUNTER — Telehealth: Payer: Self-pay | Admitting: Cardiology

## 2012-11-19 NOTE — Telephone Encounter (Signed)
New Problem    Pt asks If her (lenexa) medication mailed to her home or to the office.

## 2012-11-19 NOTE — Telephone Encounter (Signed)
Returned call to patient she stated she has been approved for free ranexa until the end of the year.Patient was told we have not received ranexa, will call her back when samples arrive.

## 2012-11-24 DIAGNOSIS — N8111 Cystocele, midline: Secondary | ICD-10-CM | POA: Diagnosis not present

## 2012-11-24 DIAGNOSIS — N3020 Other chronic cystitis without hematuria: Secondary | ICD-10-CM | POA: Diagnosis not present

## 2012-11-24 MED ORDER — RANOLAZINE ER 500 MG PO TB12: 500.0000 mg | ORAL_TABLET | Freq: Two times a day (BID) | ORAL | Status: DC

## 2012-11-24 NOTE — Telephone Encounter (Signed)
Patient called no answer.Left message to call office,we have not received Ranexa samples.

## 2012-11-24 NOTE — Telephone Encounter (Signed)
Ranexa [employer] called at # [phone] was told need to fax a ranexa prescription to fax # [phone] and ranexa samples will be mailed to patient's home.Ranexa 500 mg prescription faxed to fax # [phone].

## 2012-12-06 DIAGNOSIS — Z23 Encounter for immunization: Secondary | ICD-10-CM | POA: Diagnosis not present

## 2012-12-22 ENCOUNTER — Ambulatory Visit (INDEPENDENT_AMBULATORY_CARE_PROVIDER_SITE_OTHER): Payer: Medicare Other | Admitting: *Deleted

## 2012-12-22 DIAGNOSIS — I4891 Unspecified atrial fibrillation: Secondary | ICD-10-CM | POA: Diagnosis not present

## 2012-12-22 LAB — BASIC METABOLIC PANEL
BUN: 15 mg/dL (ref 6–23)
CO2: 28 mEq/L (ref 19–32)
Chloride: 103 mEq/L (ref 96–112)
Creatinine, Ser: 0.7 mg/dL (ref 0.4–1.2)
Glucose, Bld: 101 mg/dL — ABNORMAL HIGH (ref 70–99)
Potassium: 4.1 mEq/L (ref 3.5–5.1)

## 2012-12-22 LAB — CBC
Hemoglobin: 13 g/dL (ref 12.0–15.0)
Platelets: 130 10*3/uL — ABNORMAL LOW (ref 150.0–400.0)
RBC: 3.64 Mil/uL — ABNORMAL LOW (ref 3.87–5.11)
WBC: 6.1 10*3/uL (ref 4.5–10.5)

## 2012-12-22 MED ORDER — APIXABAN 5 MG PO TABS: ORAL_TABLET | ORAL | Status: DC

## 2012-12-22 NOTE — Progress Notes (Signed)
Pt was started on Eliquis 5mg  bid  for Atrial Fib on 05/21/2012 .    Reviewed patients medication list.  Pt is not currently on any combined P-gp and strong CYP3A4 inhibitors/inducers (ketoconazole, traconazole, ritonavir, carbamazepine, phenytoin, rifampin, St. John's wort).  Reviewed labs.  SCr  0.7, Weight 65.681kg .  Dose is appropriate based on labs.   Hgb 13.0 and HCT 38.0  A full discussion of the nature of anticoagulants has been carried out.  A benefit/risk analysis has been presented to the patient, so that they understand the justification for choosing anticoagulation with Eliquis at this time.  The need for compliance is stressed.  Pt is aware to take the medication twice daily.  Side effects of potential bleeding are discussed, including unusual colored urine or stools, coughing up blood or coffee ground emesis, nose bleeds or serious fall or head trauma.  Discussed signs and symptoms of stroke. The patient should avoid any OTC items containing aspirin or ibuprofen.  Avoid alcohol consumption.   Call if any signs of abnormal bleeding.  Has been in doughnut hole for several months and has been given samples since that time. States was able to afford until went into donut hole. Gave pt 30 day free card and samples for 2 weeks  Next lab test test in 6 months. Pt ordered CBC and BMET today will call with results pt is aware 12/23/2012 Called pt and informed that her labs were wnl  and that she is on correct dose of Eliquis. Appt  made  to be seen in April 2015. Pt instructed that if she is not able to afford Eliquis that she will need to talk with her doctor to return to coumadin and she states understanding

## 2013-01-14 ENCOUNTER — Telehealth: Payer: Self-pay | Admitting: Obstetrics & Gynecology

## 2013-01-14 NOTE — Telephone Encounter (Signed)
Spoke with patient. She has had two days of vaginal irritation and itching. No discharge. Started using Monistat 7 day cream yesterday. I advised to continue with the 7 day treatment and use as package directions indicate. To call back after using the 7 day treatment and if not improved needs office visit. Patient is agreeable to plan. She is unable to come in for OV today due to transportation.   Routing to provider for final review. Patient agreeable to disposition. Will close encounter

## 2013-01-14 NOTE — Telephone Encounter (Signed)
Pt has a yeast infection and can not come in today. She has no transportation pt would like to talk with nurse.

## 2013-03-01 ENCOUNTER — Telehealth: Payer: Self-pay | Admitting: Cardiology

## 2013-03-01 NOTE — Telephone Encounter (Signed)
New message ° ° ° ° °Want samples of eliquis--- °

## 2013-03-02 ENCOUNTER — Telehealth: Payer: Self-pay

## 2013-03-02 NOTE — Telephone Encounter (Signed)
Called patient to let her know that I placed her samples of eliquis at the front desk

## 2013-03-04 DIAGNOSIS — L570 Actinic keratosis: Secondary | ICD-10-CM | POA: Diagnosis not present

## 2013-03-04 DIAGNOSIS — D1801 Hemangioma of skin and subcutaneous tissue: Secondary | ICD-10-CM | POA: Diagnosis not present

## 2013-03-04 DIAGNOSIS — Z85828 Personal history of other malignant neoplasm of skin: Secondary | ICD-10-CM | POA: Diagnosis not present

## 2013-03-04 DIAGNOSIS — I789 Disease of capillaries, unspecified: Secondary | ICD-10-CM | POA: Diagnosis not present

## 2013-03-04 DIAGNOSIS — L821 Other seborrheic keratosis: Secondary | ICD-10-CM | POA: Diagnosis not present

## 2013-03-04 DIAGNOSIS — L819 Disorder of pigmentation, unspecified: Secondary | ICD-10-CM | POA: Diagnosis not present

## 2013-03-04 DIAGNOSIS — C44711 Basal cell carcinoma of skin of unspecified lower limb, including hip: Secondary | ICD-10-CM | POA: Diagnosis not present

## 2013-03-04 DIAGNOSIS — D0460 Carcinoma in situ of skin of unspecified upper limb, including shoulder: Secondary | ICD-10-CM | POA: Diagnosis not present

## 2013-03-04 DIAGNOSIS — L659 Nonscarring hair loss, unspecified: Secondary | ICD-10-CM | POA: Diagnosis not present

## 2013-03-09 ENCOUNTER — Other Ambulatory Visit: Payer: Self-pay | Admitting: Cardiology

## 2013-04-11 ENCOUNTER — Encounter: Payer: Self-pay | Admitting: Cardiology

## 2013-04-11 ENCOUNTER — Ambulatory Visit (INDEPENDENT_AMBULATORY_CARE_PROVIDER_SITE_OTHER): Payer: Medicare Other | Admitting: Cardiology

## 2013-04-11 VITALS — BP 138/68 | HR 64 | Ht 59.5 in | Wt 149.4 lb

## 2013-04-11 DIAGNOSIS — I2510 Atherosclerotic heart disease of native coronary artery without angina pectoris: Secondary | ICD-10-CM

## 2013-04-11 DIAGNOSIS — I4891 Unspecified atrial fibrillation: Secondary | ICD-10-CM

## 2013-04-11 DIAGNOSIS — E785 Hyperlipidemia, unspecified: Secondary | ICD-10-CM | POA: Diagnosis not present

## 2013-04-11 DIAGNOSIS — R42 Dizziness and giddiness: Secondary | ICD-10-CM | POA: Insufficient documentation

## 2013-04-11 DIAGNOSIS — R0989 Other specified symptoms and signs involving the circulatory and respiratory systems: Secondary | ICD-10-CM | POA: Insufficient documentation

## 2013-04-11 DIAGNOSIS — I10 Essential (primary) hypertension: Secondary | ICD-10-CM | POA: Diagnosis not present

## 2013-04-11 MED ORDER — APIXABAN 5 MG PO TABS: 5.0000 mg | ORAL_TABLET | Freq: Two times a day (BID) | ORAL | Status: DC

## 2013-04-11 MED ORDER — MECLIZINE HCL 25 MG PO TABS: 25.0000 mg | ORAL_TABLET | Freq: Three times a day (TID) | ORAL | Status: DC | PRN

## 2013-04-11 MED ORDER — RANOLAZINE ER 500 MG PO TB12: 500.0000 mg | ORAL_TABLET | Freq: Two times a day (BID) | ORAL | Status: DC

## 2013-04-11 NOTE — Patient Instructions (Signed)
We will schedule you for carotid dopplers.  You may take meclizine 25 mg prn dizziness.  Continue your other therapy  I will see you in 6 months.

## 2013-04-11 NOTE — Progress Notes (Signed)
[name] Date of Birth: [birth year]   History of Present Illness: [name] is seen for followup today. She has a history of coronary disease status post CABG in 1986. Cardiac catheterization 2009 showed the LIMA graft to LAD was patent but her other grafts were occluded. She had no suitable vessels for PCI or bypass. She did have an episode of atrial fibrillation March 2013 and has been on chronic anticoagulation. She reports that she is doing well from a cardiac standpoint. She has had no recurrent palpitations. She denies any significant anginal pain. Her biggest complaints today are of dizzy spells and hair loss. Her dizzy spells are described as sudden in onset with the room spinning around, sweating, and nausea. It lasts 30-45 minutes. This occurs every one to 2 weeks. No warning. Not related to position.  Current Outpatient Prescriptions on File Prior to Visit  Medication Sig Dispense Refill  . amLODipine (NORVASC) 5 MG tablet TAKE 1 TABLET DAILY.  90 tablet  0  . atorvastatin (LIPITOR) 40 MG tablet Take 1 tablet (40 mg total) by mouth daily.  90 tablet  3  . Calcium-Vitamin D (CALTRATE 600 PLUS-VIT D PO) Take by mouth daily.      . isosorbide mononitrate (IMDUR) 60 MG 24 hr tablet Take 1 tablet (60 mg total) by mouth daily.  90 tablet  3  . Lansoprazole (PREVACID PO) Take by mouth daily.        [name] losartan (COZAAR) 100 MG tablet Take 1 tablet (100 mg total) by mouth daily.  90 tablet  3  . metoprolol (LOPRESSOR) 50 MG tablet TAKE 1/2 TABLET TWICE DAILY  90 tablet  3  . NITROSTAT 0.4 MG SL tablet Place 0.4 mg under the tongue every 5 (five) minutes as needed.       . Omega-3 Fatty Acids (FISH OIL) 1000 MG CAPS Take by mouth daily.        [name] trimethoprim (TRIMPEX) 100 MG tablet Take 1 tablet (100 mg total) by mouth daily.  30 tablet  3   No current facility-administered medications on file prior to visit.    Allergies  Allergen Reactions  . Demerol Nausea And Vomiting  . Sulfa  Antibiotics Other (See Comments)    Causes weakness  . Macrodantin [Nitrofurantoin]   . Niaspan [Niacin Er]   . Tape     Past Medical History  Diagnosis Date  . Coronary artery disease     STATUS POST CABG  . Hypertension   . Hyperlipidemia   . Anxiety   . DA (degenerative arthritis)   . Skin cancer   . Hearing loss   . Heartburn   . Atrial fibrillation   . Ovarian cyst     h/o complex right ovarian cyst/followed yearly    Past Surgical History  Procedure Laterality Date  . Cardiac catheterization  04/16/2007    NORMAL LEFT VENTRICULAR SIZE AND CONTRACTILITY WITH NORMAL SYSTOLIC FUNCTION. EF 60%. THERE IS MILD TO MODERATE MITRAL INSUFFICIENCY  . Cholecystectomy    . Nephrectomy      LEFT  . Tubal ligation    . Skin cancer excision    . Coronary artery bypass graft  1986    X3, LIMA GRAFT TO THE LAD. SHE HAS POOR TARGETS  . Tonsillectomy    . Hysteroscopy  8/04    polyp resection    History  Smoking status  . Never Smoker   Smokeless tobacco  . Not on file  History  Alcohol Use No    Family History  Problem Relation Age of Onset  . Hypertension Father   . Breast cancer Sister     Review of Systems: As noted in history of present illness.  All other systems were reviewed and are negative.  Physical Exam: BP 138/68  Pulse 64  Ht 4' 11.5" (1.511 m)  Wt 149 lb 6.4 oz (67.767 kg)  BMI 29.68 kg/m2  SpO2 96%  LMP 03/03/1992 She is a pleasant white female in no acute distress. Her HEENT exam reveals that she is normocephalic, atraumatic. Significant alopecia.Pupils are equal round and reactive to light sclera are clear. Oropharynx is clear. She has no JVD. Left carotid bruit. No adenopathy or thyromegaly. Lungs are clear. Cardiac exam reveals a regular rate and rhythm without gallop or murmur. Abdomen is soft and nontender without masses or bruits. She has no edema. Pedal pulses are good. Skin is warm and dry. She is alert oriented x3. Cranial nerves II  through XII are intact.  LABORATORY DATA: Lab Results  Component Value Date   WBC 6.1 12/22/2012   HGB 13.0 12/22/2012   HCT 38.0 12/22/2012   PLT 130.0* 12/22/2012   GLUCOSE 101* 12/22/2012   CHOL 164 04/29/2012   TRIG 186.0* 04/29/2012   HDL 36.50* 04/29/2012   LDLCALC 90 04/29/2012   ALT 24 04/29/2012   AST 23 04/29/2012   NA 138 12/22/2012   K 4.1 12/22/2012   CL 103 12/22/2012   CREATININE 0.7 12/22/2012   BUN 15 12/22/2012   CO2 28 12/22/2012   INR 2.2 05/21/2012   HGBA1C  Value: 5.6 (NOTE)   The ADA recommends the following therapeutic goals for glycemic   control related to Hgb A1C measurement:   Goal of Therapy:   < 7.0% Hgb A1C   Action Suggested:  > 8.0% Hgb A1C   Ref:  Diabetes Care, 22, Suppl. 1, 1999 04/16/2007     Assessment / Plan: 1. Coronary disease with stable class II angina. She is status post CABG in 1986. Prior cardiac catheterization in 2009 showed a patent LIMA to the LAD but her other grafts were occluded. She did not have any suitable vessels for PCI or redo bypass. She is on aggressive antianginal therapy including amlodipine, nitrates, metoprolol, and Ranexa. We will continue.  2. Atrial fibrillation. She had a single episode in March of 2013 which has not recurred. She is on chronic anticoagulation with Eliquis. She remains in sinus rhythm today.   3. Hypertension, well controlled.   4. Hyperlipidemia. She is on chronic Lipitor and fish oil.   5. Vertigo. Symptoms sound vestibular. She is not orthostatic on exam. We will check carotid dopplers to check vertebral flow and because she has a bruit. Have given her a Rx for meclizine to use prn. If symptoms persist may need ENT evaluation.

## 2013-04-18 ENCOUNTER — Telehealth: Payer: Self-pay

## 2013-04-18 NOTE — Telephone Encounter (Signed)
Patient called for samples of elquis placed them up front

## 2013-04-20 ENCOUNTER — Other Ambulatory Visit (HOSPITAL_COMMUNITY): Payer: Self-pay | Admitting: Cardiology

## 2013-04-20 DIAGNOSIS — R0989 Other specified symptoms and signs involving the circulatory and respiratory systems: Secondary | ICD-10-CM

## 2013-04-20 DIAGNOSIS — R42 Dizziness and giddiness: Secondary | ICD-10-CM

## 2013-04-21 ENCOUNTER — Encounter: Payer: Self-pay | Admitting: Internal Medicine

## 2013-04-21 ENCOUNTER — Ambulatory Visit (HOSPITAL_COMMUNITY): Payer: Medicare Other | Attending: Cardiology

## 2013-04-21 DIAGNOSIS — I6529 Occlusion and stenosis of unspecified carotid artery: Secondary | ICD-10-CM | POA: Diagnosis not present

## 2013-04-21 DIAGNOSIS — E785 Hyperlipidemia, unspecified: Secondary | ICD-10-CM | POA: Diagnosis not present

## 2013-04-21 DIAGNOSIS — I4891 Unspecified atrial fibrillation: Secondary | ICD-10-CM

## 2013-04-21 DIAGNOSIS — R42 Dizziness and giddiness: Secondary | ICD-10-CM | POA: Diagnosis not present

## 2013-04-21 DIAGNOSIS — I2510 Atherosclerotic heart disease of native coronary artery without angina pectoris: Secondary | ICD-10-CM | POA: Diagnosis not present

## 2013-04-21 DIAGNOSIS — R0989 Other specified symptoms and signs involving the circulatory and respiratory systems: Secondary | ICD-10-CM | POA: Diagnosis not present

## 2013-04-21 DIAGNOSIS — I10 Essential (primary) hypertension: Secondary | ICD-10-CM | POA: Diagnosis not present

## 2013-04-21 DIAGNOSIS — I658 Occlusion and stenosis of other precerebral arteries: Secondary | ICD-10-CM | POA: Insufficient documentation

## 2013-05-12 ENCOUNTER — Other Ambulatory Visit: Payer: Self-pay | Admitting: Cardiology

## 2013-06-01 DIAGNOSIS — R351 Nocturia: Secondary | ICD-10-CM | POA: Diagnosis not present

## 2013-06-01 DIAGNOSIS — N3020 Other chronic cystitis without hematuria: Secondary | ICD-10-CM | POA: Diagnosis not present

## 2013-06-23 ENCOUNTER — Ambulatory Visit (INDEPENDENT_AMBULATORY_CARE_PROVIDER_SITE_OTHER): Payer: Medicare Other

## 2013-06-23 DIAGNOSIS — Z7901 Long term (current) use of anticoagulants: Secondary | ICD-10-CM

## 2013-06-23 DIAGNOSIS — I4891 Unspecified atrial fibrillation: Secondary | ICD-10-CM | POA: Diagnosis not present

## 2013-06-23 DIAGNOSIS — Z5181 Encounter for therapeutic drug level monitoring: Secondary | ICD-10-CM | POA: Diagnosis not present

## 2013-06-23 LAB — CBC
HEMATOCRIT: 38.6 % (ref 36.0–46.0)
Hemoglobin: 13.2 g/dL (ref 12.0–15.0)
MCHC: 34.3 g/dL (ref 30.0–36.0)
MCV: 104.5 fl — ABNORMAL HIGH (ref 78.0–100.0)
Platelets: 129 10*3/uL — ABNORMAL LOW (ref 150.0–400.0)
RBC: 3.69 Mil/uL — ABNORMAL LOW (ref 3.87–5.11)
RDW: 13.8 % (ref 11.5–14.6)
WBC: 6.8 10*3/uL (ref 4.5–10.5)

## 2013-06-23 LAB — BASIC METABOLIC PANEL
BUN: 13 mg/dL (ref 6–23)
CALCIUM: 9.3 mg/dL (ref 8.4–10.5)
CO2: 31 meq/L (ref 19–32)
Chloride: 101 mEq/L (ref 96–112)
Creatinine, Ser: 0.8 mg/dL (ref 0.4–1.2)
GFR: 73.97 mL/min (ref >60.00)
Glucose, Bld: 106 mg/dL — ABNORMAL HIGH (ref 70–99)
POTASSIUM: 4.1 meq/L (ref 3.5–5.1)
SODIUM: 138 meq/L (ref 135–145)

## 2013-06-23 NOTE — Progress Notes (Signed)
Pt was started on Eliquis for Afib on May 21, 2012.    Reviewed patients medication list.  Pt is not currently on any combined P-gp and strong CYP3A4 inhibitors/inducers (ketoconazole, traconazole, ritonavir, carbamazepine, phenytoin, rifampin, St. John's wort).  Reviewed labs.  SCr 0.8, Weight 67 kg, CrCl- 62 ml/min.  Dose appropriate based on CrCl.   Hgb and HCT Within Normal Limits  A full discussion of the nature of anticoagulants has been carried out.  A benefit/risk analysis has been presented to the patient, so that they understand the justification for choosing anticoagulation with Eliquis at this time.  The need for compliance is stressed.  Pt is aware to take the medication twice daily.  Side effects of potential bleeding are discussed, including unusual colored urine or stools, coughing up blood or coffee ground emesis, nose bleeds or serious fall or head trauma. Discussed signs and symptoms of stroke. The patient should avoid any OTC items containing aspirin or ibuprofen.  Avoid alcohol consumption. Call if any signs of abnormal bleeding.  Discussed financial obligations and resolved any difficulty in obtaining medication. Next lab test test in 6 months.

## 2013-06-23 NOTE — Patient Instructions (Addendum)

## 2013-06-25 ENCOUNTER — Other Ambulatory Visit: Payer: Self-pay | Admitting: Cardiology

## 2013-07-20 DIAGNOSIS — L819 Disorder of pigmentation, unspecified: Secondary | ICD-10-CM | POA: Diagnosis not present

## 2013-07-20 DIAGNOSIS — Z85828 Personal history of other malignant neoplasm of skin: Secondary | ICD-10-CM | POA: Diagnosis not present

## 2013-07-20 DIAGNOSIS — D485 Neoplasm of uncertain behavior of skin: Secondary | ICD-10-CM | POA: Diagnosis not present

## 2013-07-20 DIAGNOSIS — L821 Other seborrheic keratosis: Secondary | ICD-10-CM | POA: Diagnosis not present

## 2013-07-20 DIAGNOSIS — L570 Actinic keratosis: Secondary | ICD-10-CM | POA: Diagnosis not present

## 2013-07-20 DIAGNOSIS — L820 Inflamed seborrheic keratosis: Secondary | ICD-10-CM | POA: Diagnosis not present

## 2013-08-19 ENCOUNTER — Other Ambulatory Visit: Payer: Self-pay | Admitting: Cardiology

## 2013-08-25 ENCOUNTER — Other Ambulatory Visit: Payer: Self-pay | Admitting: Cardiology

## 2013-09-03 ENCOUNTER — Other Ambulatory Visit: Payer: Self-pay | Admitting: Cardiology

## 2013-09-06 NOTE — Telephone Encounter (Signed)
1. Coronary disease with stable class II angina. She is status post CABG in 1986. Prior cardiac catheterization in 2009 showed a patent LIMA to the LAD but her other grafts were occluded. She did not have any suitable vessels for PCI or redo bypass. She is on aggressive antianginal therapy including amlodipine, nitrates, metoprolol, and Ranexa. We will continue.  2. Atrial fibrillation. She had a single episode in March of 2013 which has not recurred. She is on chronic anticoagulation with Eliquis. She remains in sinus rhythm today  apixaban (ELIQUIS) 5 MG TABS tablet 60 tablet 3 04/11/2013        Take 1 tablet (5 mg total) by mouth 2 (two) times daily. - Oral   ranolazine (RANEXA) 500 MG 12 hr tablet 180 tablet 3 04/11/2013 Take 1 tablet (500 mg total) by mouth 2 (two) times daily. - Oral  03/09/2013 11:28 AM Peter Martinique, MD [facility] [phone]

## 2013-09-29 ENCOUNTER — Other Ambulatory Visit: Payer: Self-pay | Admitting: Cardiology

## 2013-10-14 ENCOUNTER — Telehealth: Payer: Self-pay

## 2013-10-14 NOTE — Telephone Encounter (Signed)
Patient called for samples of eliquis placed them up front 

## 2013-10-24 ENCOUNTER — Ambulatory Visit: Payer: Medicare Other | Admitting: Cardiology

## 2013-11-08 ENCOUNTER — Telehealth: Payer: Self-pay | Admitting: Cardiology

## 2013-11-08 NOTE — Telephone Encounter (Signed)
Returning your call. °

## 2013-11-08 NOTE — Telephone Encounter (Signed)
Returned call to patient she stated she needed samples of ranexa and eliquis.Samples of ranexa and eliquis left at front desk of our [facility] office.Stated she will be bringing patient assistance form for ranexa for Dr.Jordan to sign.Will check with Erasmo Downer our pharmacist about patient assistance program for eliquis.

## 2013-11-15 ENCOUNTER — Other Ambulatory Visit: Payer: Self-pay | Admitting: Cardiology

## 2013-11-18 ENCOUNTER — Telehealth: Payer: Self-pay | Admitting: Pharmacist Clinician (PhC)/ Clinical Pharmacy Specialist

## 2013-11-18 MED ORDER — APIXABAN 5 MG PO TABS: 5.0000 mg | ORAL_TABLET | Freq: Two times a day (BID) | ORAL | Status: DC

## 2013-11-25 ENCOUNTER — Encounter: Payer: Self-pay | Admitting: Cardiology

## 2013-11-25 ENCOUNTER — Other Ambulatory Visit: Payer: Self-pay

## 2013-11-25 ENCOUNTER — Ambulatory Visit (INDEPENDENT_AMBULATORY_CARE_PROVIDER_SITE_OTHER): Payer: Medicare Other | Admitting: Cardiology

## 2013-11-25 VITALS — BP 130/80 | HR 63

## 2013-11-25 DIAGNOSIS — I4891 Unspecified atrial fibrillation: Secondary | ICD-10-CM

## 2013-11-25 DIAGNOSIS — I209 Angina pectoris, unspecified: Secondary | ICD-10-CM | POA: Diagnosis not present

## 2013-11-25 DIAGNOSIS — I10 Essential (primary) hypertension: Secondary | ICD-10-CM | POA: Diagnosis not present

## 2013-11-25 DIAGNOSIS — E785 Hyperlipidemia, unspecified: Secondary | ICD-10-CM

## 2013-11-25 DIAGNOSIS — I2510 Atherosclerotic heart disease of native coronary artery without angina pectoris: Secondary | ICD-10-CM

## 2013-11-25 DIAGNOSIS — I480 Paroxysmal atrial fibrillation: Secondary | ICD-10-CM

## 2013-11-25 DIAGNOSIS — I25118 Atherosclerotic heart disease of native coronary artery with other forms of angina pectoris: Secondary | ICD-10-CM

## 2013-11-25 MED ORDER — ISOSORBIDE MONONITRATE ER 60 MG PO TB24: 60.0000 mg | ORAL_TABLET | Freq: Every day | ORAL | Status: DC

## 2013-11-25 NOTE — Patient Instructions (Signed)
Continue your current therapy  I will see you in 6 months.   

## 2013-11-25 NOTE — Progress Notes (Signed)
[name] Date of Birth: [birth year]   History of Present Illness: [name] is seen for followup today. She has a history of coronary disease status post CABG in 1986. Cardiac catheterization 2009 showed the LIMA graft to LAD was patent but her other grafts were occluded. She had no suitable vessels for PCI or bypass. She did have an episode of atrial fibrillation March 2013 and has been on chronic anticoagulation. She had an excellent response to Ranexa and states she almost never has chest pain.  She has had no recurrent palpitations. She does still have vertigo. She does vestibular exercises and takes antivert prn.  Current Outpatient Prescriptions on File Prior to Visit  Medication Sig Dispense Refill  . amLODipine (NORVASC) 5 MG tablet TAKE 1 TABLET DAILY.  90 tablet  0  . apixaban (ELIQUIS) 5 MG TABS tablet Take 1 tablet (5 mg total) by mouth 2 (two) times daily.  180 tablet  3  . atorvastatin (LIPITOR) 40 MG tablet TAKE 1 TABLET DAILY.  90 tablet  1  . isosorbide mononitrate (IMDUR) 60 MG 24 hr tablet TAKE ONE TABLET DAILY  30 tablet  1  . Lansoprazole (PREVACID PO) Take by mouth daily.        . meclizine (ANTIVERT) 25 MG tablet Take 1 tablet (25 mg total) by mouth 3 (three) times daily as needed for dizziness.  30 tablet  0  . metoprolol (LOPRESSOR) 50 MG tablet TAKE 1/2 TABLET TWICE DAILY  90 tablet  3  . NITROSTAT 0.4 MG SL tablet Place 0.4 mg under the tongue every 5 (five) minutes as needed.       . Omega-3 Fatty Acids (FISH OIL) 1000 MG CAPS Take by mouth daily.        [name] RANEXA 500 MG 12 hr tablet TAKE 1 TABLET 2 TIMES A DAY  180 tablet  1  . trimethoprim (TRIMPEX) 100 MG tablet Take 1 tablet (100 mg total) by mouth daily.  30 tablet  3  . losartan (COZAAR) 100 MG tablet Take 1 tablet (100 mg total) by mouth daily.  90 tablet  3   No current facility-administered medications on file prior to visit.    Allergies  Allergen Reactions  . Demerol Nausea And Vomiting  .  Sulfa Antibiotics Other (See Comments)    Causes weakness  . Macrodantin [Nitrofurantoin]   . Niaspan [Niacin Er]   . Tape     Past Medical History  Diagnosis Date  . Coronary artery disease     STATUS POST CABG  . Hypertension   . Hyperlipidemia   . Anxiety   . DA (degenerative arthritis)   . Skin cancer   . Hearing loss   . Heartburn   . Atrial fibrillation   . Ovarian cyst     h/o complex right ovarian cyst/followed yearly    Past Surgical History  Procedure Laterality Date  . Cardiac catheterization  04/16/2007    NORMAL LEFT VENTRICULAR SIZE AND CONTRACTILITY WITH NORMAL SYSTOLIC FUNCTION. EF 60%. THERE IS MILD TO MODERATE MITRAL INSUFFICIENCY  . Cholecystectomy    . Nephrectomy      LEFT  . Tubal ligation    . Skin cancer excision    . Coronary artery bypass graft  1986    X3, LIMA GRAFT TO THE LAD. SHE HAS POOR TARGETS  . Tonsillectomy    . Hysteroscopy  8/04    polyp resection    History  Smoking status  . Never  Smoker   Smokeless tobacco  . Not on file    History  Alcohol Use No    Family History  Problem Relation Age of Onset  . Hypertension Father   . Breast cancer Sister     Review of Systems: As noted in history of present illness.  All other systems were reviewed and are negative.  Physical Exam: BP 130/80  Pulse 63  LMP 03/03/1992 She is a pleasant white female in no acute distress. Her HEENT exam reveals that she is normocephalic, atraumatic. Chronic alopecia.Pupils are equal round and reactive to light sclera are clear. Oropharynx is clear. She has no JVD. Left carotid bruit. No adenopathy or thyromegaly. Lungs are clear. Cardiac exam reveals a regular rate and rhythm without gallop or murmur. Abdomen is soft and nontender without masses or bruits. She has no edema. Pedal pulses are good. Skin is warm and dry. She is alert oriented x3. Cranial nerves II through XII are intact.  LABORATORY DATA: Lab Results  Component Value Date   WBC  6.8 06/23/2013   HGB 13.2 06/23/2013   HCT 38.6 06/23/2013   PLT 129.0* 06/23/2013   GLUCOSE 106* 06/23/2013   CHOL 164 04/29/2012   TRIG 186.0* 04/29/2012   HDL 36.50* 04/29/2012   LDLCALC 90 04/29/2012   ALT 24 04/29/2012   AST 23 04/29/2012   NA 138 06/23/2013   K 4.1 06/23/2013   CL 101 06/23/2013   CREATININE 0.8 06/23/2013   BUN 13 06/23/2013   CO2 31 06/23/2013   INR 2.2 05/21/2012   HGBA1C  Value: 5.6 (NOTE)   The ADA recommends the following therapeutic goals for glycemic   control related to Hgb A1C measurement:   Goal of Therapy:   < 7.0% Hgb A1C   Action Suggested:  > 8.0% Hgb A1C   Ref:  Diabetes Care, 22, Suppl. 1, 1999 04/16/2007    Ecg: NSR with nonspecific TWA.  Assessment / Plan: 1. Coronary disease with stable class II angina. She is status post CABG in 1986. Prior cardiac catheterization in 2009 showed a patent LIMA to the LAD but her other grafts were occluded. She did not have any suitable vessels for PCI or redo bypass. She is on aggressive antianginal therapy including amlodipine, nitrates, metoprolol, and Ranexa. We will continue.  2. Atrial fibrillation. She had a single episode in March of 2013 which has not recurred. She is on chronic anticoagulation with Eliquis. She remains in sinus rhythm today.   3. Hypertension, well controlled.   4. Hyperlipidemia. She is on chronic Lipitor and fish oil. Will check fasting lab work in 6 months.  5. Vertigo. Vestibular.

## 2013-11-28 ENCOUNTER — Other Ambulatory Visit: Payer: Self-pay | Admitting: Cardiology

## 2013-11-28 NOTE — Telephone Encounter (Signed)
Pt to sign for assistance program at [place] Friday

## 2013-12-13 ENCOUNTER — Telehealth: Payer: Self-pay | Admitting: Cardiology

## 2013-12-13 NOTE — Telephone Encounter (Signed)
Elly Modena called and ask me to placed eliquis samples up front for this patient , placed a month supply up front

## 2013-12-13 NOTE — Telephone Encounter (Signed)
Returned call to patient samples of Ranexa 500 mg left at front desk of [facility] office.Samples of Eliquis 5 mg left at front desk of [facility] office.Advised will send message to Erasmo Downer to see if she has heard back from patient assistance program.

## 2013-12-14 NOTE — Telephone Encounter (Signed)
Received msg from [employer], pt does not qualify for PA program - has not spent 3% of income on meds in 2015 yet.  Pt notified, will try to keep Eliquis samples available

## 2013-12-22 DIAGNOSIS — Z23 Encounter for immunization: Secondary | ICD-10-CM | POA: Diagnosis not present

## 2014-01-02 ENCOUNTER — Encounter: Payer: Self-pay | Admitting: Cardiology

## 2014-01-09 ENCOUNTER — Other Ambulatory Visit: Payer: Self-pay | Admitting: Obstetrics & Gynecology

## 2014-01-09 NOTE — Telephone Encounter (Signed)
Last refill 06/29/12 #30g/1R

## 2014-01-10 NOTE — Telephone Encounter (Signed)
Can you please call pt and see what symptoms she is having.  We haven't seen her in awhile and I need to see if pt needs appt.  Thanks.

## 2014-01-11 ENCOUNTER — Other Ambulatory Visit: Payer: Self-pay | Admitting: Cardiology

## 2014-01-11 NOTE — Telephone Encounter (Signed)
Patient states she she has irritation in her groin area same Sx as last year. Advise pt that since we have not seen her in so long she needs to come in. We can giver her a Rx until her appt. Pt agreed and made appt 01/20/14 @2 :30pm.  Dr. Lestine Box

## 2014-01-11 NOTE — Addendum Note (Signed)
Addended by: Megan Salon on: 01/11/2014 01:12 PM   Modules accepted: Medications

## 2014-01-20 ENCOUNTER — Encounter: Payer: Self-pay | Admitting: Obstetrics & Gynecology

## 2014-01-20 ENCOUNTER — Ambulatory Visit (INDEPENDENT_AMBULATORY_CARE_PROVIDER_SITE_OTHER): Payer: Medicare Other | Admitting: Obstetrics & Gynecology

## 2014-01-20 VITALS — BP 120/68 | HR 72 | Ht 59.5 in | Wt 148.0 lb

## 2014-01-20 DIAGNOSIS — I2510 Atherosclerotic heart disease of native coronary artery without angina pectoris: Secondary | ICD-10-CM | POA: Diagnosis not present

## 2014-01-20 DIAGNOSIS — B372 Candidiasis of skin and nail: Secondary | ICD-10-CM | POA: Diagnosis not present

## 2014-01-20 MED ORDER — TRIMETHOPRIM 100 MG PO TABS: 100.0000 mg | ORAL_TABLET | Freq: Every day | ORAL | Status: DC

## 2014-01-20 MED ORDER — NYSTATIN 100000 UNIT/GM EX CREA
TOPICAL_CREAM | CUTANEOUS | Status: DC
Start: 2014-01-20 — End: 2015-03-30

## 2014-01-24 ENCOUNTER — Telehealth: Payer: Self-pay | Admitting: Cardiology

## 2014-01-24 NOTE — Telephone Encounter (Signed)
Please call,need some samples of Eliquis and Ranexa please.

## 2014-01-25 NOTE — Telephone Encounter (Signed)
Returned call to patient samples of ranexa 500 mg left at front desk of [facility] office.

## 2014-01-30 NOTE — Progress Notes (Signed)
Subjective:     Patient ID: [name], female   DOB: 01-09-1935, 79 y.o.   MRN: [id number]  HPI 79 yo MWF with recurrent skin yeast infections here for OV.  Pt was out of nystatin cream so skin has gotten raw and erythematous again.  No pain.  H/O recurrent UTI's.  Has seen urologist and is now on antibiotic suppression.  Has not had another UTI since.    Review of Systems  All other systems reviewed and are negative.      Objective:   Physical Exam  Constitutional: She is oriented to person, place, and time. She appears well-developed and well-nourished.  Abdominal: Soft. Bowel sounds are normal.  Neurological: She is alert and oriented to person, place, and time.  Skin: Rash (inner thighs, satellite lesions present) noted.  Psychiatric: She has a normal mood and affect.       Assessment:     Inner thigh yeast     Plan:     Nystatin cream up to TID to inner thighs.  Pt knows if not resolved in 7 days, needs to call for appt.  Rx to pharmacy.

## 2014-02-22 ENCOUNTER — Other Ambulatory Visit: Payer: Self-pay | Admitting: Cardiology

## 2014-03-08 DIAGNOSIS — D485 Neoplasm of uncertain behavior of skin: Secondary | ICD-10-CM | POA: Diagnosis not present

## 2014-03-08 DIAGNOSIS — C44729 Squamous cell carcinoma of skin of left lower limb, including hip: Secondary | ICD-10-CM | POA: Diagnosis not present

## 2014-03-08 DIAGNOSIS — Z85828 Personal history of other malignant neoplasm of skin: Secondary | ICD-10-CM | POA: Diagnosis not present

## 2014-03-08 DIAGNOSIS — L570 Actinic keratosis: Secondary | ICD-10-CM | POA: Diagnosis not present

## 2014-03-08 DIAGNOSIS — L814 Other melanin hyperpigmentation: Secondary | ICD-10-CM | POA: Diagnosis not present

## 2014-03-08 DIAGNOSIS — L821 Other seborrheic keratosis: Secondary | ICD-10-CM | POA: Diagnosis not present

## 2014-03-17 ENCOUNTER — Telehealth: Payer: Self-pay | Admitting: Cardiology

## 2014-03-17 DIAGNOSIS — E785 Hyperlipidemia, unspecified: Secondary | ICD-10-CM

## 2014-03-17 DIAGNOSIS — Z79899 Other long term (current) drug therapy: Secondary | ICD-10-CM

## 2014-03-17 DIAGNOSIS — R632 Polyphagia: Secondary | ICD-10-CM

## 2014-03-17 NOTE — Telephone Encounter (Signed)
Labs ordered - BMET, lipid, liver - per Malachy Mood, LPN Labs mailed to patient.

## 2014-03-17 NOTE — Telephone Encounter (Signed)
Pt called in wanting her lab orders mailed to her so that she can have them done before she comes in to see Dr. Martinique.   Thanks

## 2014-04-08 ENCOUNTER — Other Ambulatory Visit: Payer: Self-pay | Admitting: Cardiology

## 2014-04-27 ENCOUNTER — Ambulatory Visit (HOSPITAL_COMMUNITY)
Admission: RE | Admit: 2014-04-27 | Discharge: 2014-04-27 | Disposition: A | Discharge status: Home / Self Care | Payer: Medicare Other | Source: Ambulatory Visit | Attending: Cardiovascular Disease | Admitting: Cardiovascular Disease

## 2014-04-27 ENCOUNTER — Other Ambulatory Visit: Payer: Self-pay

## 2014-04-27 DIAGNOSIS — I779 Disorder of arteries and arterioles, unspecified: Secondary | ICD-10-CM | POA: Diagnosis not present

## 2014-04-27 DIAGNOSIS — R0989 Other specified symptoms and signs involving the circulatory and respiratory systems: Secondary | ICD-10-CM

## 2014-04-27 NOTE — Progress Notes (Signed)
Carotid Duplex Completed. Mild calcific plaque in bilateral ICAs with no evidence of a significant stenosis.  Oda Cogan, BS, RDMS, RVT

## 2014-05-10 ENCOUNTER — Other Ambulatory Visit: Payer: Self-pay | Admitting: Cardiology

## 2014-05-12 ENCOUNTER — Other Ambulatory Visit: Payer: Self-pay | Admitting: Cardiology

## 2014-05-15 NOTE — Telephone Encounter (Signed)
Rx has been sent to the pharmacy electronically. ° °

## 2014-06-08 ENCOUNTER — Telehealth: Payer: Self-pay | Admitting: Cardiology

## 2014-06-08 NOTE — Telephone Encounter (Signed)
Returned call to patient she stated she wanted to know since she will be fasting for her appointment with Dr.Jordan on Monday is it ok to take her medication.Advised ok to take medications.

## 2014-06-08 NOTE — Telephone Encounter (Signed)
Please call,question about whether she takes her medicine on Monday morning before her appointment.

## 2014-06-12 ENCOUNTER — Ambulatory Visit (INDEPENDENT_AMBULATORY_CARE_PROVIDER_SITE_OTHER): Payer: Medicare Other | Admitting: Cardiology

## 2014-06-12 ENCOUNTER — Encounter: Payer: Self-pay | Admitting: Cardiology

## 2014-06-12 VITALS — BP 138/68 | HR 66 | Ht 61.0 in | Wt 148.3 lb

## 2014-06-12 DIAGNOSIS — I209 Angina pectoris, unspecified: Secondary | ICD-10-CM | POA: Diagnosis not present

## 2014-06-12 DIAGNOSIS — E785 Hyperlipidemia, unspecified: Secondary | ICD-10-CM

## 2014-06-12 DIAGNOSIS — I10 Essential (primary) hypertension: Secondary | ICD-10-CM

## 2014-06-12 DIAGNOSIS — I480 Paroxysmal atrial fibrillation: Secondary | ICD-10-CM

## 2014-06-12 DIAGNOSIS — I25708 Atherosclerosis of coronary artery bypass graft(s), unspecified, with other forms of angina pectoris: Secondary | ICD-10-CM

## 2014-06-12 DIAGNOSIS — I4891 Unspecified atrial fibrillation: Secondary | ICD-10-CM | POA: Diagnosis not present

## 2014-06-12 LAB — HEPATIC FUNCTION PANEL
ALK PHOS: 68 U/L (ref 39–117)
ALT: 18 U/L (ref 0–35)
AST: 20 U/L (ref 0–37)
Albumin: 4.1 g/dL (ref 3.5–5.2)
BILIRUBIN TOTAL: 0.8 mg/dL (ref 0.2–1.2)
Bilirubin, Direct: 0.2 mg/dL (ref 0.0–0.3)
Indirect Bilirubin: 0.6 mg/dL (ref 0.2–1.2)
Total Protein: 7.4 g/dL (ref 6.0–8.3)

## 2014-06-12 LAB — LIPID PANEL
Cholesterol: 169 mg/dL (ref 0–200)
HDL: 36 mg/dL — AB (ref >=46)
LDL CALC: 98 mg/dL (ref 0–99)
TRIGLYCERIDES: 176 mg/dL — AB (ref <150)
Total CHOL/HDL Ratio: 4.7 Ratio
VLDL: 35 mg/dL (ref 0–40)

## 2014-06-12 LAB — CBC WITH DIFFERENTIAL/PLATELET
BASOS PCT: 0 % (ref 0–1)
Basophils Absolute: 0 10*3/uL (ref 0.0–0.1)
Eosinophils Absolute: 0.1 10*3/uL (ref 0.0–0.7)
Eosinophils Relative: 2 % (ref 0–5)
HEMATOCRIT: 39.5 % (ref 36.0–46.0)
HEMOGLOBIN: 13.6 g/dL (ref 12.0–15.0)
LYMPHS ABS: 2.9 10*3/uL (ref 0.7–4.0)
Lymphocytes Relative: 42 % (ref 12–46)
MCH: 34.8 pg — ABNORMAL HIGH (ref 26.0–34.0)
MCHC: 34.4 g/dL (ref 30.0–36.0)
MCV: 101 fL — AB (ref 78.0–100.0)
MONOS PCT: 11 % (ref 3–12)
MPV: 9.6 fL (ref 8.6–12.4)
Monocytes Absolute: 0.8 10*3/uL (ref 0.1–1.0)
NEUTROS ABS: 3.2 10*3/uL (ref 1.7–7.7)
Neutrophils Relative %: 45 % (ref 43–77)
Platelets: 160 10*3/uL (ref 150–400)
RBC: 3.91 MIL/uL (ref 3.87–5.11)
RDW: 13.5 % (ref 11.5–15.5)
WBC: 7 10*3/uL (ref 4.0–10.5)

## 2014-06-12 LAB — BASIC METABOLIC PANEL
BUN: 12 mg/dL (ref 6–23)
CALCIUM: 9.3 mg/dL (ref 8.4–10.5)
CO2: 29 mEq/L (ref 19–32)
Chloride: 103 mEq/L (ref 96–112)
Creat: 0.75 mg/dL (ref 0.50–1.10)
Glucose, Bld: 97 mg/dL (ref 70–99)
POTASSIUM: 4.2 meq/L (ref 3.5–5.3)
Sodium: 139 mEq/L (ref 135–145)

## 2014-06-12 NOTE — Progress Notes (Signed)
[name] Date of Birth: 04-15-1934   History of Present Illness: [name] is seen for followup today. She has a history of coronary disease status post CABG in 1986. Cardiac catheterization 2009 showed the LIMA graft to LAD was patent but her other grafts were occluded. She had no suitable vessels for PCI or bypass. She did have an episode of atrial fibrillation March 2013 and has been on chronic anticoagulation. She had an excellent response to Ranexa and states she rarely has chest pain now.  She has  no recurrent palpitations.   Current Outpatient Prescriptions on File Prior to Visit  Medication Sig Dispense Refill  . amLODipine (NORVASC) 5 MG tablet TAKE ONE TABLET EVERY DAY 90 tablet 0  . atorvastatin (LIPITOR) 40 MG tablet TAKE 1 TABLET DAILY. 90 tablet 0  . ELIQUIS 5 MG TABS tablet TAKE ONE TABLET TWICE DAILY 60 tablet 5  . isosorbide mononitrate (IMDUR) 60 MG 24 hr tablet Take 1 tablet (60 mg total) by mouth daily. 90 tablet 3  . Lansoprazole (PREVACID PO) Take by mouth daily.      . metoprolol (LOPRESSOR) 50 MG tablet TAKE 1/2 TABLET TWICE DAILY 90 tablet 0  . nystatin cream (MYCOSTATIN) APPLY TO AFFECTED AREA TWICE DAILY FOR 7 DAYS 30 g 3  . Omega-3 Fatty Acids (FISH OIL) 1000 MG CAPS Take by mouth daily.      [name] RANEXA 500 MG 12 hr tablet TAKE 1 TABLET 2 TIMES A DAY 180 tablet 1  . ranolazine (RANEXA) 500 MG 12 hr tablet Take 500 mg by mouth.    . trimethoprim (TRIMPEX) 100 MG tablet Take 1 tablet (100 mg total) by mouth daily. 90 tablet 4  . losartan (COZAAR) 100 MG tablet Take 1 tablet (100 mg total) by mouth daily. 90 tablet 3   No current facility-administered medications on file prior to visit.    Allergies  Allergen Reactions  . Demerol Nausea And Vomiting  . Sulfa Antibiotics Other (See Comments)    Causes weakness  . Macrodantin [Nitrofurantoin]   . Niaspan [Niacin Er]   . Tape     Past Medical History  Diagnosis Date  . Coronary artery disease    STATUS POST CABG  . Hypertension   . Hyperlipidemia   . Anxiety   . DA (degenerative arthritis)   . Skin cancer   . Hearing loss   . Heartburn   . Atrial fibrillation   . Ovarian cyst     h/o complex right ovarian cyst/followed yearly    Past Surgical History  Procedure Laterality Date  . Cardiac catheterization  04/16/2007    NORMAL LEFT VENTRICULAR SIZE AND CONTRACTILITY WITH NORMAL SYSTOLIC FUNCTION. EF 60%. THERE IS MILD TO MODERATE MITRAL INSUFFICIENCY  . Cholecystectomy    . Nephrectomy      LEFT  . Tubal ligation    . Skin cancer excision    . Coronary artery bypass graft  1986    X3, LIMA GRAFT TO THE LAD. SHE HAS POOR TARGETS  . Tonsillectomy    . Hysteroscopy  8/04    polyp resection    History  Smoking status  . Never Smoker   Smokeless tobacco  . Never Used    History  Alcohol Use No    Family History  Problem Relation Age of Onset  . Hypertension Father   . Breast cancer Sister     Review of Systems: As noted in history of present illness.  All other  systems were reviewed and are negative.  Physical Exam: BP 138/68 mmHg  Pulse 66  Ht 5\' 1"  (1.549 m)  Wt 148 lb 4.8 oz (67.268 kg)  BMI 28.04 kg/m2  LMP 03/03/1992 She is a pleasant white female in no acute distress. Her HEENT exam reveals that she is normocephalic, atraumatic. Chronic alopecia.Pupils are equal round and reactive to light sclera are clear. Oropharynx is clear. She has no JVD. Left carotid bruit. No adenopathy or thyromegaly. Lungs are clear. Cardiac exam reveals a regular rate and rhythm without gallop or murmur. Abdomen is soft and nontender without masses or bruits. She has no edema. Pedal pulses are good. Skin is warm and dry. She is alert oriented x3. Cranial nerves II through XII are intact.  LABORATORY DATA: Lab Results  Component Value Date   WBC 6.8 06/23/2013   HGB 13.2 06/23/2013   HCT 38.6 06/23/2013   PLT 129.0* 06/23/2013   GLUCOSE 106* 06/23/2013   CHOL 164  04/29/2012   TRIG 186.0* 04/29/2012   HDL 36.50* 04/29/2012   LDLCALC 90 04/29/2012   ALT 24 04/29/2012   AST 23 04/29/2012   NA 138 06/23/2013   K 4.1 06/23/2013   CL 101 06/23/2013   CREATININE 0.8 06/23/2013   BUN 13 06/23/2013   CO2 31 06/23/2013   INR 2.2 05/21/2012   HGBA1C  04/16/2007    5.6 (NOTE)   The ADA recommends the following therapeutic goals for glycemic   control related to Hgb A1C measurement:   Goal of Therapy:   < 7.0% Hgb A1C   Action Suggested:  > 8.0% Hgb A1C   Ref:  Diabetes Care, 22, Suppl. 1, 1999   Recent carotid dopplers showed no significant disease.  Assessment / Plan: 1. Coronary disease with stable class II angina. She is status post CABG in 1986. Prior cardiac catheterization in 2009 showed a patent LIMA to the LAD but her other grafts were occluded. She did not have any suitable vessels for PCI or redo bypass. She is on aggressive antianginal therapy including amlodipine, nitrates, metoprolol, and Ranexa. We will continue.  2. Atrial fibrillation. She had a single episode in March of 2013 which has not recurred. She is on chronic anticoagulation with Eliquis.   3. Hypertension, well controlled.   4. Hyperlipidemia. She is on chronic Lipitor and fish oil. Will check fasting lab work today including CBC, chemistries and lipid panel.

## 2014-06-12 NOTE — Patient Instructions (Signed)
Continue your current therapy  We will check lab work today  I will see you in 6 months   

## 2014-07-07 DIAGNOSIS — L089 Local infection of the skin and subcutaneous tissue, unspecified: Secondary | ICD-10-CM | POA: Diagnosis not present

## 2014-07-07 DIAGNOSIS — Z85828 Personal history of other malignant neoplasm of skin: Secondary | ICD-10-CM | POA: Diagnosis not present

## 2014-07-07 DIAGNOSIS — L821 Other seborrheic keratosis: Secondary | ICD-10-CM | POA: Diagnosis not present

## 2014-07-07 DIAGNOSIS — D485 Neoplasm of uncertain behavior of skin: Secondary | ICD-10-CM | POA: Diagnosis not present

## 2014-07-07 DIAGNOSIS — L814 Other melanin hyperpigmentation: Secondary | ICD-10-CM | POA: Diagnosis not present

## 2014-07-07 DIAGNOSIS — L570 Actinic keratosis: Secondary | ICD-10-CM | POA: Diagnosis not present

## 2014-08-17 ENCOUNTER — Other Ambulatory Visit: Payer: Self-pay | Admitting: Cardiology

## 2014-08-17 NOTE — Telephone Encounter (Signed)
Rx(s) sent to pharmacy electronically.  

## 2014-08-28 ENCOUNTER — Other Ambulatory Visit: Payer: Self-pay | Admitting: Cardiology

## 2014-09-07 DIAGNOSIS — I10 Essential (primary) hypertension: Secondary | ICD-10-CM | POA: Diagnosis not present

## 2014-09-07 DIAGNOSIS — Z1389 Encounter for screening for other disorder: Secondary | ICD-10-CM | POA: Diagnosis not present

## 2014-09-07 DIAGNOSIS — Z6828 Body mass index (BMI) 28.0-28.9, adult: Secondary | ICD-10-CM | POA: Diagnosis not present

## 2014-09-07 DIAGNOSIS — M545 Low back pain: Secondary | ICD-10-CM | POA: Diagnosis not present

## 2014-09-07 DIAGNOSIS — I2510 Atherosclerotic heart disease of native coronary artery without angina pectoris: Secondary | ICD-10-CM | POA: Diagnosis not present

## 2014-09-07 DIAGNOSIS — I480 Paroxysmal atrial fibrillation: Secondary | ICD-10-CM | POA: Diagnosis not present

## 2014-09-11 ENCOUNTER — Other Ambulatory Visit: Payer: Self-pay | Admitting: Internal Medicine

## 2014-09-11 DIAGNOSIS — M545 Low back pain: Secondary | ICD-10-CM

## 2014-09-14 ENCOUNTER — Telehealth: Payer: Self-pay

## 2014-09-14 NOTE — Telephone Encounter (Signed)
Received a call from patient requesting ranexa 500 mg and eliquis 5 mg samples.Samples left at [facility] office front desk.

## 2014-09-20 ENCOUNTER — Ambulatory Visit
Admission: RE | Admit: 2014-09-20 | Discharge: 2014-09-20 | Disposition: A | Discharge status: Home / Self Care | Payer: Medicare Other | Source: Ambulatory Visit | Attending: Internal Medicine | Admitting: Internal Medicine

## 2014-09-20 ENCOUNTER — Other Ambulatory Visit: Payer: Self-pay | Admitting: Internal Medicine

## 2014-09-20 DIAGNOSIS — M545 Low back pain: Secondary | ICD-10-CM

## 2014-09-20 MED ORDER — METHYLPREDNISOLONE ACETATE 40 MG/ML INJ SUSP (RADIOLOG
120.0000 mg | Freq: Once | INTRAMUSCULAR | Status: AC
  Administered 2014-09-20: 120 mg via EPIDURAL

## 2014-09-20 MED ORDER — IOHEXOL 180 MG/ML  SOLN
1.0000 mL | Freq: Once | INTRAMUSCULAR | Status: AC | PRN
  Administered 2014-09-20: 1 mL via EPIDURAL

## 2014-09-20 NOTE — Discharge Instructions (Signed)

## 2014-10-25 ENCOUNTER — Other Ambulatory Visit: Payer: Self-pay | Admitting: Cardiology

## 2014-10-25 NOTE — Telephone Encounter (Signed)
Rx request sent to pharmacy.  

## 2014-10-30 ENCOUNTER — Telehealth: Payer: Self-pay | Admitting: Cardiology

## 2014-10-30 NOTE — Telephone Encounter (Signed)
Patient calling the office for samples of medication:   1.  What medication and dosage are you requesting samples for? Ranexa 500mg  , Eliquis 5mg    2.  Are you currently out of this medication? Yes   3. Are you requesting samples to get you through until a mail order prescription arrives? No

## 2014-10-30 NOTE — Telephone Encounter (Signed)
Medication samples have been provided to the patient.  Drug name: ranexa 500mg   Qty: 56  LOT: [id number]  Exp.Date: 10/2017           eliquis 5mg           23            [id number]         03/2017  Samples left at front desk for patient pick-up. Patient notified.  Sheral Apley M 12:53 PM 10/30/2014

## 2014-11-24 ENCOUNTER — Telehealth: Payer: Self-pay | Admitting: Cardiology

## 2014-11-24 NOTE — Telephone Encounter (Signed)
Patient calling the office for samples of medication:   1.  What medication and dosage are you requesting samples for? Eliquis 5mg  and Ranexa 500mg    2.  Are you currently out of this medication? Yes    3. Are you requesting samples to get you through until a mail order prescription arrives? No , she is in the Donut hole

## 2014-11-24 NOTE — Telephone Encounter (Signed)
Patient given #4 boxes of Eliquis 77m and #2 boxes of Renexa 500 mg  Patient will pick up on Monday

## 2014-11-27 ENCOUNTER — Other Ambulatory Visit: Payer: Self-pay | Admitting: Internal Medicine

## 2014-11-27 DIAGNOSIS — M545 Low back pain: Principal | ICD-10-CM

## 2014-11-27 DIAGNOSIS — G8929 Other chronic pain: Secondary | ICD-10-CM

## 2014-12-13 ENCOUNTER — Other Ambulatory Visit: Payer: Self-pay | Admitting: Internal Medicine

## 2014-12-13 ENCOUNTER — Ambulatory Visit
Admission: RE | Admit: 2014-12-13 | Discharge: 2014-12-13 | Disposition: A | Discharge status: Home / Self Care | Payer: Medicare Other | Source: Ambulatory Visit | Attending: Internal Medicine | Admitting: Internal Medicine

## 2014-12-13 DIAGNOSIS — M545 Low back pain: Principal | ICD-10-CM

## 2014-12-13 DIAGNOSIS — G8929 Other chronic pain: Secondary | ICD-10-CM

## 2014-12-13 MED ORDER — METHYLPREDNISOLONE ACETATE 40 MG/ML INJ SUSP (RADIOLOG
120.0000 mg | Freq: Once | INTRAMUSCULAR | Status: AC
  Administered 2014-12-13: 120 mg via EPIDURAL

## 2014-12-13 MED ORDER — IOHEXOL 180 MG/ML  SOLN
1.0000 mL | Freq: Once | INTRAMUSCULAR | Status: DC | PRN
  Administered 2014-12-13: 1 mL via EPIDURAL

## 2014-12-13 NOTE — Discharge Instructions (Signed)

## 2014-12-21 ENCOUNTER — Ambulatory Visit: Payer: Medicare Other | Admitting: Cardiology

## 2014-12-29 ENCOUNTER — Telehealth: Payer: Self-pay | Admitting: Cardiology

## 2014-12-29 DIAGNOSIS — Z23 Encounter for immunization: Secondary | ICD-10-CM | POA: Diagnosis not present

## 2014-12-29 NOTE — Telephone Encounter (Signed)
Patient called in asking for samples of Ranexa and Eliquis due to being in the donut hole.  I provided a months worth of samples.

## 2014-12-29 NOTE — Telephone Encounter (Signed)
Patient calling the office for samples of medication:   1.  What medication and dosage are you requesting samples for? Ranexa and Eliquis   2.  Are you currently out of this medication? Yes she is in the donut hole   3. Are you requesting samples to get you through until you receive your prescription? No

## 2015-01-03 ENCOUNTER — Telehealth: Payer: Self-pay | Admitting: Cardiology

## 2015-01-03 NOTE — Telephone Encounter (Signed)
Patient states she is in "doughnut hole" and is requesting samples for the following... Patient calling the office for samples of medication:   1.  What medication and dosage are you requesting samples for? Eliquis 5 mg  2.  Are you currently out of this medication? Few more left  Patient calling the office for samples of medication:   1.  What medication and dosage are you requesting samples for? Ranexa 500 mg  2.  Are you currently out of this medication? Few more left

## 2015-01-03 NOTE — Telephone Encounter (Signed)
Returned call to patient.Samples of Ranexa 500 mg and Eliquis 2.5 mg take ( 2 tablets to = 5 mg ) left at [employer] office front desk.

## 2015-01-17 ENCOUNTER — Telehealth: Payer: Self-pay | Admitting: Cardiology

## 2015-01-17 NOTE — Telephone Encounter (Signed)
Pt notified that samples are available at front desk.

## 2015-01-17 NOTE — Telephone Encounter (Signed)
Patient calling the office for samples of medication:   1.  What medication and dosage are you requesting samples for? Eliquis and Ranexa   2.  Are you currently out of this medication? Yes   *Pt has an appt with Dr. Martinique on 11/17@10 :45am*

## 2015-01-18 ENCOUNTER — Encounter: Payer: Self-pay | Admitting: Cardiology

## 2015-01-18 ENCOUNTER — Ambulatory Visit (INDEPENDENT_AMBULATORY_CARE_PROVIDER_SITE_OTHER): Payer: Medicare Other | Admitting: Cardiology

## 2015-01-18 VITALS — BP 140/80 | HR 69 | Ht 61.0 in | Wt 130.8 lb

## 2015-01-18 DIAGNOSIS — I10 Essential (primary) hypertension: Secondary | ICD-10-CM | POA: Diagnosis not present

## 2015-01-18 DIAGNOSIS — K5909 Other constipation: Secondary | ICD-10-CM | POA: Diagnosis not present

## 2015-01-18 DIAGNOSIS — R112 Nausea with vomiting, unspecified: Secondary | ICD-10-CM | POA: Diagnosis not present

## 2015-01-18 DIAGNOSIS — I480 Paroxysmal atrial fibrillation: Secondary | ICD-10-CM | POA: Diagnosis not present

## 2015-01-18 DIAGNOSIS — Z6825 Body mass index (BMI) 25.0-25.9, adult: Secondary | ICD-10-CM | POA: Diagnosis not present

## 2015-01-18 DIAGNOSIS — I25708 Atherosclerosis of coronary artery bypass graft(s), unspecified, with other forms of angina pectoris: Secondary | ICD-10-CM

## 2015-01-18 DIAGNOSIS — R634 Abnormal weight loss: Secondary | ICD-10-CM | POA: Diagnosis not present

## 2015-01-18 NOTE — Progress Notes (Signed)
[name] Date of Birth: 1934-12-17   History of Present Illness: [name] is seen for followup today. She has a history of coronary disease status post CABG in 1986. Cardiac catheterization 2009 showed the LIMA graft to LAD was patent but her other grafts were occluded. She had no suitable vessels for PCI or bypass. She did have an episode of atrial fibrillation March 2013 and has been on chronic anticoagulation. She had an excellent response to Ranexa and states she rarely has chest pain now.  She has  no recurrent palpitations.  On follow up today she continues to do very well from a cardiac standpoint. Over the past month she has been experiencing symptoms of nausea, vomiting and intermittent diarrhea. Has intermittent pain in the mid abdomen. No fever. No bleeding.   Current Outpatient Prescriptions on File Prior to Visit  Medication Sig Dispense Refill  . amLODipine (NORVASC) 5 MG tablet TAKE ONE TABLET EVERY DAY 90 tablet 2  . atorvastatin (LIPITOR) 40 MG tablet TAKE 1 TABLET DAILY. 90 tablet 0  . Calcium Carbonate 1500 (600 CA) MG TABS Take by mouth.    Arne Cleveland 5 MG TABS tablet TAKE ONE TABLET TWICE DAILY 60 tablet 5  . isosorbide mononitrate (IMDUR) 60 MG 24 hr tablet Take 1 tablet (60 mg total) by mouth daily. 90 tablet 1  . Lansoprazole (PREVACID PO) Take by mouth daily.      . metoprolol (LOPRESSOR) 50 MG tablet TAKE 1/2 TABLET TWICE DAILY 90 tablet 2  . nystatin cream (MYCOSTATIN) APPLY TO AFFECTED AREA TWICE DAILY FOR 7 DAYS 30 g 3  . Omega-3 Fatty Acids (FISH OIL) 1000 MG CAPS Take by mouth daily.      [name] RANEXA 500 MG 12 hr tablet TAKE 1 TABLET 2 TIMES A DAY 180 tablet 1  . ranolazine (RANEXA) 500 MG 12 hr tablet Take 500 mg by mouth.    . trimethoprim (TRIMPEX) 100 MG tablet Take 1 tablet (100 mg total) by mouth daily. 90 tablet 4  . losartan (COZAAR) 100 MG tablet Take 1 tablet (100 mg total) by mouth daily. 90 tablet 3   No current facility-administered medications  on file prior to visit.    Allergies  Allergen Reactions  . Demerol Nausea And Vomiting  . Niaspan [Niacin Er] Other (See Comments)    Facial flushing  . Sulfa Antibiotics Other (See Comments)    Causes weakness  . Tape Itching and Other (See Comments)    Burns, too    Past Medical History  Diagnosis Date  . Coronary artery disease     STATUS POST CABG  . Hypertension   . Hyperlipidemia   . Anxiety   . DA (degenerative arthritis)   . Skin cancer   . Hearing loss   . Heartburn   . Atrial fibrillation ([place])   . Ovarian cyst     h/o complex right ovarian cyst/followed yearly    Past Surgical History  Procedure Laterality Date  . Cardiac catheterization  04/16/2007    NORMAL LEFT VENTRICULAR SIZE AND CONTRACTILITY WITH NORMAL SYSTOLIC FUNCTION. EF 60%. THERE IS MILD TO MODERATE MITRAL INSUFFICIENCY  . Cholecystectomy    . Nephrectomy      LEFT  . Tubal ligation    . Skin cancer excision    . Coronary artery bypass graft  1986    X3, LIMA GRAFT TO THE LAD. SHE HAS POOR TARGETS  . Tonsillectomy    . Hysteroscopy  8/04  polyp resection    History  Smoking status  . Never Smoker   Smokeless tobacco  . Never Used    History  Alcohol Use No    Family History  Problem Relation Age of Onset  . Hypertension Father   . Breast cancer Sister     Review of Systems: As noted in history of present illness.  All other systems were reviewed and are negative.  Physical Exam: BP 140/80 mmHg  Pulse 69  Ht 5\' 1"  (1.549 m)  Wt 59.33 kg (130 lb 12.8 oz)  BMI 24.73 kg/m2  LMP 03/03/1992 She is a pleasant white female in no acute distress. Her HEENT exam reveals that she is normocephalic, atraumatic. Chronic alopecia.Pupils are equal round and reactive to light sclera are clear. Oropharynx is clear. She has no JVD. Left carotid bruit. No adenopathy or thyromegaly. Lungs are clear. Cardiac exam reveals a regular rate and rhythm without gallop or murmur. Abdomen is soft  with some tenderness left mid abdomen. No rebound. No masses or bruits. She has no edema. Pedal pulses are good. Skin is warm and dry. She is alert oriented x3. Cranial nerves II through XII are intact.  LABORATORY DATA: Lab Results  Component Value Date   WBC 7.0 06/12/2014   HGB 13.6 06/12/2014   HCT 39.5 06/12/2014   PLT 160 06/12/2014   GLUCOSE 97 06/12/2014   CHOL 169 06/12/2014   TRIG 176* 06/12/2014   HDL 36* 06/12/2014   LDLCALC 98 06/12/2014   ALT 18 06/12/2014   AST 20 06/12/2014   NA 139 06/12/2014   K 4.2 06/12/2014   CL 103 06/12/2014   CREATININE 0.75 06/12/2014   BUN 12 06/12/2014   CO2 29 06/12/2014   INR 2.2 05/21/2012   HGBA1C  04/16/2007    5.6 (NOTE)   The ADA recommends the following therapeutic goals for glycemic   control related to Hgb A1C measurement:   Goal of Therapy:   < 7.0% Hgb A1C   Action Suggested:  > 8.0% Hgb A1C   Ref:  Diabetes Care, 22, Suppl. 1, 1999   Ecg today shows NSR with PACs. Nonspecific TWA. I have personally reviewed and interpreted this study.   Assessment / Plan: 1. Coronary disease with stable class II angina. She is status post CABG in 1986. Prior cardiac catheterization in 2009 showed a patent LIMA to the LAD but her other grafts were occluded. She did not have any suitable vessels for PCI or redo bypass. She is on aggressive antianginal therapy including amlodipine, nitrates, metoprolol, and Ranexa. We will continue.  2. Atrial fibrillation. She had a single episode in March of 2013 which has not recurred. She is on chronic anticoagulation with Eliquis.   3. Hypertension, well controlled.   4. Hyperlipidemia. She is on chronic Lipitor and fish oil.   5. Abdominal pain, nausea, and diarrhea. Recommend she be seen by primary care.

## 2015-01-18 NOTE — Patient Instructions (Signed)
Continue your current therapy  You need to go see Dr. Philip Aspen about your abdominal complaints.  I will see you in 6 months.

## 2015-01-19 ENCOUNTER — Other Ambulatory Visit: Payer: Self-pay | Admitting: Internal Medicine

## 2015-01-19 DIAGNOSIS — R634 Abnormal weight loss: Secondary | ICD-10-CM

## 2015-01-29 DIAGNOSIS — Z9049 Acquired absence of other specified parts of digestive tract: Secondary | ICD-10-CM | POA: Diagnosis not present

## 2015-01-29 DIAGNOSIS — R634 Abnormal weight loss: Secondary | ICD-10-CM | POA: Diagnosis not present

## 2015-01-29 DIAGNOSIS — N2881 Hypertrophy of kidney: Secondary | ICD-10-CM | POA: Diagnosis not present

## 2015-01-29 DIAGNOSIS — R112 Nausea with vomiting, unspecified: Secondary | ICD-10-CM | POA: Diagnosis not present

## 2015-01-29 DIAGNOSIS — K5909 Other constipation: Secondary | ICD-10-CM | POA: Diagnosis not present

## 2015-01-29 DIAGNOSIS — Z905 Acquired absence of kidney: Secondary | ICD-10-CM | POA: Diagnosis not present

## 2015-01-31 ENCOUNTER — Other Ambulatory Visit: Payer: Self-pay | Admitting: Cardiology

## 2015-01-31 NOTE — Telephone Encounter (Signed)
Rx request sent to pharmacy.  

## 2015-02-02 ENCOUNTER — Telehealth: Payer: Self-pay

## 2015-02-02 NOTE — Telephone Encounter (Signed)
Patient never picked up samples of Ranexa 500mg  and Eliquis 5 mg. Drugs returned to stock closet.

## 2015-02-20 ENCOUNTER — Other Ambulatory Visit: Payer: Self-pay | Admitting: Cardiology

## 2015-02-20 ENCOUNTER — Other Ambulatory Visit: Payer: Self-pay | Admitting: Obstetrics & Gynecology

## 2015-02-20 NOTE — Telephone Encounter (Signed)
Pt needs appt for follow up.  Doesn't have to be an AEX.  Thanks.  Recurrent UTI.  RF done for #90/0RF.

## 2015-02-20 NOTE — Telephone Encounter (Signed)
Medication refill request: Trimpex 100 mg Last AEX:  ? Last seen 06/29/2012 for UTI Next AEX: Not scheduled Last MMG (if hormonal medication request): 12/09/2006 BIRADS 1 Refill authorized: 01/20/2014 Trimpex #90 tabs 4 Refills  Today: Trimpex #90 tabs 4 Refills?

## 2015-02-22 NOTE — Telephone Encounter (Signed)
Patient is scheduled for f/u appt. 03/29/2014.//kg

## 2015-03-28 ENCOUNTER — Ambulatory Visit: Payer: Medicare Other | Admitting: Cardiology

## 2015-03-30 ENCOUNTER — Encounter: Payer: Self-pay | Admitting: Obstetrics & Gynecology

## 2015-03-30 ENCOUNTER — Ambulatory Visit (INDEPENDENT_AMBULATORY_CARE_PROVIDER_SITE_OTHER): Payer: Medicare Other | Admitting: Obstetrics & Gynecology

## 2015-03-30 VITALS — BP 118/80 | HR 68 | Resp 16 | Ht 61.0 in | Wt 125.0 lb

## 2015-03-30 DIAGNOSIS — R197 Diarrhea, unspecified: Secondary | ICD-10-CM | POA: Diagnosis not present

## 2015-03-30 DIAGNOSIS — N390 Urinary tract infection, site not specified: Secondary | ICD-10-CM | POA: Diagnosis not present

## 2015-03-30 DIAGNOSIS — B372 Candidiasis of skin and nail: Secondary | ICD-10-CM

## 2015-03-30 LAB — POCT URINALYSIS DIPSTICK
BILIRUBIN UA: NEGATIVE
GLUCOSE UA: NEGATIVE
Ketones, UA: NEGATIVE
Nitrite, UA: NEGATIVE
RBC UA: NEGATIVE
Urobilinogen, UA: NEGATIVE
pH, UA: 6

## 2015-03-30 LAB — COMPREHENSIVE METABOLIC PANEL
ALBUMIN: 4 g/dL (ref 3.6–5.1)
ALT: 19 U/L (ref 6–29)
AST: 26 U/L (ref 10–35)
Alkaline Phosphatase: 57 U/L (ref 33–130)
BILIRUBIN TOTAL: 0.9 mg/dL (ref 0.2–1.2)
BUN: 11 mg/dL (ref 7–25)
CO2: 24 mmol/L (ref 20–31)
CREATININE: 0.9 mg/dL (ref 0.60–0.93)
Calcium: 9.3 mg/dL (ref 8.6–10.4)
Chloride: 98 mmol/L (ref 98–110)
Glucose, Bld: 99 mg/dL (ref 65–99)
Potassium: 3.7 mmol/L (ref 3.5–5.3)
SODIUM: 136 mmol/L (ref 135–146)
Total Protein: 7.8 g/dL (ref 6.1–8.1)

## 2015-03-30 LAB — CBC WITH DIFFERENTIAL/PLATELET
Basophils Absolute: 0 10*3/uL (ref 0.0–0.1)
Basophils Relative: 0 % (ref 0–1)
EOS ABS: 0.2 10*3/uL (ref 0.0–0.7)
EOS PCT: 2 % (ref 0–5)
HCT: 41.9 % (ref 36.0–46.0)
Hemoglobin: 14.9 g/dL (ref 12.0–15.0)
Lymphocytes Relative: 48 % — ABNORMAL HIGH (ref 12–46)
Lymphs Abs: 4.4 10*3/uL — ABNORMAL HIGH (ref 0.7–4.0)
MCH: 36.1 pg — AB (ref 26.0–34.0)
MCHC: 35.6 g/dL (ref 30.0–36.0)
MCV: 101.5 fL — AB (ref 78.0–100.0)
MONO ABS: 1.3 10*3/uL — AB (ref 0.1–1.0)
MPV: 9.4 fL (ref 8.6–12.4)
Monocytes Relative: 14 % — ABNORMAL HIGH (ref 3–12)
Neutro Abs: 3.3 10*3/uL (ref 1.7–7.7)
Neutrophils Relative %: 36 % — ABNORMAL LOW (ref 43–77)
PLATELETS: 183 10*3/uL (ref 150–400)
RBC: 4.13 MIL/uL (ref 3.87–5.11)
RDW: 13.3 % (ref 11.5–15.5)
WBC: 9.1 10*3/uL (ref 4.0–10.5)

## 2015-03-30 LAB — POC HEMOCCULT BLD/STL (OFFICE/1-CARD/DIAGNOSTIC): Fecal Occult Blood, POC: NEGATIVE

## 2015-03-30 LAB — TSH: TSH: 1.331 u[IU]/mL (ref 0.350–4.500)

## 2015-03-30 MED ORDER — TRIMETHOPRIM 100 MG PO TABS: ORAL_TABLET | ORAL | Status: AC

## 2015-03-30 MED ORDER — NYSTATIN 100000 UNIT/GM EX CREA: TOPICAL_CREAM | CUTANEOUS | Status: DC

## 2015-03-30 NOTE — Progress Notes (Signed)
Subjective:     Patient ID: [name], female   DOB: May 25, 1934, 81 y.o.   MRN: [id number]  HPI 81 yo G1P1 here for complaint of possible UTI as well as recurrent episodes of diarrhea with associated weight loss.  Pt reports she's having a little more incontinence but denies dysuria, increased urgency or visible blood in urine.  Reports having four episodes of nausea that lasts for several days.  She does throw up with the nausea.  This then turns into diarrhea for two or three days.  This whole event lasts 7-10 days.  Once all of this is over, she still feels very sensitive with what she eats and also is very weak.    Gennaro Africa, her PCP, in November.  She had a CT in November which I can review through [place].  This was negative.  Pt did not call his office back and has just been "dealing with the episodes.  She is getting anxious about the recurrence of them, however.  Also, she has lost weight since last year.  She is down #23 during that time but pt states she has to be very careful with all the things she eats.    Pt does have quesitons about ovarian cancer and Johnson's baby powder.  Advised to use the non-talc but reassured that CT also views gynecologic organs and that this was normal.    Review of Systems  Constitutional: Positive for appetite change, fatigue and unexpected weight change. Negative for fever and chills.  Respiratory: Negative.   Cardiovascular: Negative.   Gastrointestinal: Positive for nausea, vomiting and diarrhea. Negative for abdominal pain, constipation and blood in stool.  Endocrine: Negative for cold intolerance, heat intolerance and polydipsia.  Genitourinary: Negative.   Skin: Negative.   Neurological: Negative.   Psychiatric/Behavioral: Negative.   All other systems reviewed and are negative.      Objective:   Physical Exam  Constitutional: She is oriented to person, place, and time. She appears well-developed and well-nourished.  HENT:   Head: Normocephalic and atraumatic.  Neck: Normal range of motion.  Cardiovascular: Normal rate and regular rhythm.   Pulmonary/Chest: Effort normal and breath sounds normal.  Abdominal: Soft. Bowel sounds are normal. She exhibits distension. She exhibits no mass. There is no splenomegaly or hepatomegaly. There is no tenderness. There is no rebound, no guarding and no CVA tenderness. No hernia. Hernia confirmed negative in the right inguinal area and confirmed negative in the left inguinal area.  Genitourinary: Rectum normal, vagina normal and uterus normal. Rectal exam shows no external hemorrhoid. Guaiac negative stool.    There is no rash, tenderness, lesion or injury on the right labia. There is no rash, tenderness, lesion or injury on the left labia. Cervix exhibits no discharge. Right adnexum displays no mass, no tenderness and no fullness. Left adnexum displays no mass, no tenderness and no fullness.  Third degree cystocele present.  No pelvic masses.  Lymphadenopathy:       Right: No inguinal adenopathy present.       Left: No inguinal adenopathy present.  Neurological: She is alert and oriented to person, place, and time.  Skin: Skin is warm and dry.  Psychiatric: She has a normal mood and affect.       Assessment:     H/O recurrent UTIs Recurrent nausea/emesis/diarrhea Weight loss  Inguinal rash c/w yeast    Plan:     CBC with diff, CMP, TSH Stool culture, WBC, C-ff, and ova/parasites May  need GI referral Trimethroprim 100mg  daily.  #90/3RF Nystatin cream rx to pharmacy Urine culture pending

## 2015-04-01 MED ORDER — CIPROFLOXACIN HCL 250 MG PO TABS: 250.0000 mg | ORAL_TABLET | Freq: Two times a day (BID) | ORAL | Status: DC

## 2015-04-02 LAB — URINE CULTURE

## 2015-04-04 ENCOUNTER — Other Ambulatory Visit: Payer: Self-pay | Admitting: Obstetrics & Gynecology

## 2015-04-04 ENCOUNTER — Other Ambulatory Visit (INDEPENDENT_AMBULATORY_CARE_PROVIDER_SITE_OTHER): Payer: Medicare Other

## 2015-04-04 DIAGNOSIS — R197 Diarrhea, unspecified: Secondary | ICD-10-CM | POA: Diagnosis not present

## 2015-04-06 LAB — OVA AND PARASITE EXAMINATION: OP: NONE SEEN

## 2015-04-06 LAB — FECAL LACTOFERRIN, QUANT: Lactoferrin: NEGATIVE

## 2015-04-07 ENCOUNTER — Other Ambulatory Visit: Payer: Self-pay | Admitting: Cardiology

## 2015-04-09 LAB — STOOL CULTURE

## 2015-04-11 LAB — CLOSTRIDIUM DIFFICILE BY PCR: Toxigenic C. Difficile by PCR: NOT DETECTED

## 2015-04-12 ENCOUNTER — Ambulatory Visit (INDEPENDENT_AMBULATORY_CARE_PROVIDER_SITE_OTHER): Payer: Medicare Other | Admitting: Obstetrics & Gynecology

## 2015-04-12 DIAGNOSIS — N390 Urinary tract infection, site not specified: Secondary | ICD-10-CM | POA: Diagnosis not present

## 2015-04-12 NOTE — Progress Notes (Signed)
Patient arrived for urine TOC.  She was unable to give sample.  Patient was given sterile cup and urine hat to collect sample at home.  She will drop off sample this afternoon.

## 2015-04-13 LAB — URINE CULTURE: Colony Count: >=100000

## 2015-04-16 ENCOUNTER — Telehealth: Payer: Self-pay | Admitting: Obstetrics & Gynecology

## 2015-04-16 NOTE — Telephone Encounter (Signed)
Patient left message on answering machine checking on results from urine test.

## 2015-04-16 NOTE — Telephone Encounter (Signed)
Spoke with patient. Advised of message as seen below from [place]. She is agreeable and verbalizes understanding.   Notes Recorded by Megan Salon, MD on 04/16/2015 at 5:58 AM Please let pt know that the culture shows no specific bacteria so no additional antibiotic treatment needed. Pt was offered GI consultation at [facility] but declined. Please let her know if she changes her mind, to please call. FYI, this looks like a contaminated urine with the bacteria in it. Pt needs to call with any change in symptoms.  Routing to provider for final review. Patient agreeable to disposition. Will close encounter.

## 2015-04-17 NOTE — Progress Notes (Signed)
Subjective:     Patient ID: [name], female   DOB: 1935-02-05, 81 y.o.   MRN: [id number]  HPI 81 yo G1P21 MWF here for follow up after being treated for UTI.  She is going to leave sample for repeat culture as prior one showed Citrobacter.  She was also having intermittent GI issues with periods of nausea and emesis with decreased food intake.  She's had a CT of the abdomen and pelvis as well as stool testing for C. Diff, ova and parasites, WBCs, and bacteria.  This was all negative.  Reviewed results with pt.  She reports feeling better from GI standpoint since being on the antibiotics for the UTI.  She is also not having urgency like she was having.  Pt has known cystocele and incomplete emptying which does increase her risks for UTI.  Encouraged pt to see GI for additional evaluation due to weight loss and recurrent nausea/emesis issues.  She adamantly declines at this time as she states she is feeling much better.    Advised her to really consider this but if she has another one of these episodes, she needs to call and have a repeat urine culture and if that is negative, I would highly encourage a GI referral/visit.  She states she will consider this but for now, she will not go to see a gastroenterologist.  Review of Systems  Constitutional: Negative for fever and appetite change.  Genitourinary: Positive for urgency (that is improved). Negative for vaginal discharge and vaginal pain.  Musculoskeletal: Negative for back pain.       Objective:   Physical Exam  Constitutional: She appears well-developed and well-nourished.  Cardiovascular: Normal rate and regular rhythm.   Pulmonary/Chest: Effort normal and breath sounds normal.  No CVA tenderness.  Abdominal: Soft. Bowel sounds are normal. She exhibits no distension and no mass. There is no tenderness. There is no rebound and no guarding.  Skin: Skin is warm and dry.  Psychiatric: She has a normal mood and affect.  Vitals  reviewed.      Assessment:     Recent UTI Cystocele with incomplete emptying and hx of recurrent UTIs  Recurrent episodes of nausea/emesis with decreased appetite Weight loss    Plan:     Urine culture pending Pt encouraged to consider GI referral.  Declines for now but states she will let me know if she has another one of these episodes.

## 2015-05-09 ENCOUNTER — Other Ambulatory Visit: Payer: Self-pay | Admitting: Cardiology

## 2015-05-09 NOTE — Telephone Encounter (Signed)
Rx(s) sent to pharmacy electronically.  

## 2015-05-12 ENCOUNTER — Other Ambulatory Visit: Payer: Self-pay | Admitting: Cardiology

## 2015-07-19 ENCOUNTER — Encounter: Payer: Self-pay | Admitting: Cardiology

## 2015-07-19 ENCOUNTER — Ambulatory Visit (INDEPENDENT_AMBULATORY_CARE_PROVIDER_SITE_OTHER): Payer: Medicare Other | Admitting: Cardiology

## 2015-07-19 VITALS — BP 114/66 | HR 57 | Ht 61.0 in | Wt 128.0 lb

## 2015-07-19 DIAGNOSIS — I2510 Atherosclerotic heart disease of native coronary artery without angina pectoris: Secondary | ICD-10-CM | POA: Diagnosis not present

## 2015-07-19 DIAGNOSIS — E785 Hyperlipidemia, unspecified: Secondary | ICD-10-CM | POA: Diagnosis not present

## 2015-07-19 DIAGNOSIS — I480 Paroxysmal atrial fibrillation: Secondary | ICD-10-CM | POA: Diagnosis not present

## 2015-07-19 DIAGNOSIS — I10 Essential (primary) hypertension: Secondary | ICD-10-CM | POA: Diagnosis not present

## 2015-07-19 DIAGNOSIS — I209 Angina pectoris, unspecified: Secondary | ICD-10-CM

## 2015-07-19 LAB — COMPREHENSIVE METABOLIC PANEL
ALBUMIN: 4.1 g/dL (ref 3.6–5.1)
ALK PHOS: 69 U/L (ref 33–130)
ALT: 17 U/L (ref 6–29)
AST: 20 U/L (ref 10–35)
BILIRUBIN TOTAL: 0.8 mg/dL (ref 0.2–1.2)
BUN: 13 mg/dL (ref 7–25)
CALCIUM: 9.2 mg/dL (ref 8.6–10.4)
CO2: 26 mmol/L (ref 20–31)
Chloride: 102 mmol/L (ref 98–110)
Creat: 0.75 mg/dL (ref 0.60–0.93)
Glucose, Bld: 91 mg/dL (ref 65–99)
Potassium: 4.3 mmol/L (ref 3.5–5.3)
Sodium: 139 mmol/L (ref 135–146)
Total Protein: 7.6 g/dL (ref 6.1–8.1)

## 2015-07-19 LAB — TSH: TSH: 2.04 mIU/L

## 2015-07-19 MED ORDER — METOPROLOL TARTRATE 50 MG PO TABS: 25.0000 mg | ORAL_TABLET | Freq: Two times a day (BID) | ORAL | Status: DC

## 2015-07-19 NOTE — Progress Notes (Signed)
[name] Date of Birth: 1934/09/26   History of Present Illness: [name] is seen for followup today. She has a history of coronary disease status post CABG in 1986. Cardiac catheterization 2009 showed the LIMA graft to LAD was patent but her other grafts were occluded. She had no suitable vessels for PCI or bypass. She did have an episode of atrial fibrillation March 2013 and has been on chronic anticoagulation. She had an excellent response to Ranexa.   On follow up today she continues to do very well from a cardiac standpoint. She denies any chest pain, dyspnea, palpitations or edema.   Current Outpatient Prescriptions on File Prior to Visit  Medication Sig Dispense Refill  . amLODipine (NORVASC) 5 MG tablet TAKE ONE TABLET EVERY DAY 90 tablet 1  . atorvastatin (LIPITOR) 40 MG tablet Take 1 tablet (40 mg total) by mouth daily. 90 tablet 2  . ELIQUIS 5 MG TABS tablet TAKE ONE TABLET TWICE DAILY 60 tablet 5  . isosorbide mononitrate (IMDUR) 60 MG 24 hr tablet Take 1 tablet (60 mg total) by mouth daily. 90 tablet 1  . Lansoprazole (PREVACID PO) Take by mouth daily.      [name] nystatin cream (MYCOSTATIN) APPLY TO AFFECTED AREA TWICE DAILY FOR 7 DAYS 30 g 3  . Omega-3 Fatty Acids (FISH OIL) 1000 MG CAPS Take by mouth daily.      [name] RANEXA 500 MG 12 hr tablet TAKE 1 TABLET 2 TIMES A DAY 180 tablet 0  . trimethoprim (TRIMPEX) 100 MG tablet Take 1 tablet (100 mg total) by mouth daily. 90 tablet 4  . losartan (COZAAR) 100 MG tablet Take 1 tablet (100 mg total) by mouth daily. 90 tablet 3   No current facility-administered medications on file prior to visit.    Allergies  Allergen Reactions  . Demerol Nausea And Vomiting  . Niacin   . Niaspan [Niacin Er] Other (See Comments)    Facial flushing  . Sulfa Antibiotics Other (See Comments)    Causes weakness  . Tape Itching and Other (See Comments)    Burns, too    Past Medical History  Diagnosis Date  . Coronary artery disease    STATUS POST CABG  . Hypertension   . Hyperlipidemia   . Anxiety   . DA (degenerative arthritis)   . Skin cancer   . Hearing loss   . Heartburn   . Atrial fibrillation ([name])   . Ovarian cyst     h/o complex right ovarian cyst/followed yearly    Past Surgical History  Procedure Laterality Date  . Cardiac catheterization  04/16/2007    NORMAL LEFT VENTRICULAR SIZE AND CONTRACTILITY WITH NORMAL SYSTOLIC FUNCTION. EF 60%. THERE IS MILD TO MODERATE MITRAL INSUFFICIENCY  . Cholecystectomy    . Nephrectomy      LEFT  . Tubal ligation    . Skin cancer excision    . Coronary artery bypass graft  1986    X3, LIMA GRAFT TO THE LAD. SHE HAS POOR TARGETS  . Tonsillectomy    . Hysteroscopy  8/04    polyp resection    History  Smoking status  . Never Smoker   Smokeless tobacco  . Never Used    History  Alcohol Use No    Family History  Problem Relation Age of Onset  . Hypertension Father   . Breast cancer Sister     Review of Systems: As noted in history of present illness.  All other  systems were reviewed and are negative.  Physical Exam: BP 114/66 mmHg  Pulse 57  Ht 5\' 1"  (1.549 m)  Wt 58.06 kg (128 lb)  BMI 24.20 kg/m2  LMP 03/03/1992 She is a pleasant white female in no acute distress. Her HEENT exam reveals that she is normocephalic, atraumatic. Chronic alopecia. Pupils are equal round and reactive to light sclera are clear. Oropharynx is clear. She has no JVD. Left carotid bruit. No adenopathy or thyromegaly. Lungs are clear. Cardiac exam reveals a regular rate and rhythm without gallop or murmur. Abdomen is soft with some tenderness left mid abdomen. No rebound. No masses or bruits. She has no edema. Pedal pulses are good. Skin is warm and dry. She is alert oriented x3. Cranial nerves II through XII are intact.  LABORATORY DATA: Lab Results  Component Value Date   WBC 9.1 03/30/2015   HGB 14.9 03/30/2015   HCT 41.9 03/30/2015   PLT 183 03/30/2015   GLUCOSE 99  03/30/2015   CHOL 169 06/12/2014   TRIG 176* 06/12/2014   HDL 36* 06/12/2014   LDLCALC 98 06/12/2014   ALT 19 03/30/2015   AST 26 03/30/2015   NA 136 03/30/2015   K 3.7 03/30/2015   CL 98 03/30/2015   CREATININE 0.90 03/30/2015   BUN 11 03/30/2015   CO2 24 03/30/2015   TSH 1.331 03/30/2015   INR 2.2 05/21/2012   HGBA1C  04/16/2007    5.6 (NOTE)   The ADA recommends the following therapeutic goals for glycemic   control related to Hgb A1C measurement:   Goal of Therapy:   < 7.0% Hgb A1C   Action Suggested:  > 8.0% Hgb A1C   Ref:  Diabetes Care, 22, Suppl. 1, 1999   Ecg today shows NSR with rate 57.  Poor R wave progression.  I have personally reviewed and interpreted this study.   Assessment / Plan: 1. Coronary disease with stable class 1-2 angina. She is status post CABG in 1986. Prior cardiac catheterization in 2009 showed a patent LIMA to the LAD but her other grafts were occluded. She did not have any suitable vessels for PCI or redo bypass. She is on aggressive antianginal therapy including amlodipine, nitrates, metoprolol, and Ranexa. We will continue.  2. Atrial fibrillation. She had a single episode in March of 2013 which has not recurred. She is on chronic anticoagulation with Eliquis.   3. Hypertension, well controlled.   4. Hyperlipidemia. She is on chronic Lipitor and fish oil. Will check fasting lab work today.

## 2015-07-19 NOTE — Patient Instructions (Signed)
Continue your current therapy  We will check fasting lab work today  I will see you in 6 months. 

## 2015-08-08 ENCOUNTER — Telehealth: Payer: Self-pay | Admitting: Obstetrics & Gynecology

## 2015-08-08 NOTE — Telephone Encounter (Signed)
Return call to patient. She complains of dysuria and vaginal irritation that began on Friday 08/03/15.  She states she was waiting for symptoms to improve and they have not.  She has pain with urination but states this does not feel like this is like her normal UTI symptoms.  Reports pain in lower stomach that is intermittent. Has not treated with any OTC medications.   Reviewed with Dr. Talbert Nan. Orders received for UA and culture, can be completed here in office if possible as patient reports she has difficulty voiding in office.   Patient is offered to come today for UA and dip in office and will review and can follow up with Dr. Sabra Heck 08/09/15. Patient declines this option as she lives in [place]. Her PCP is also in [place].  Patient requests to collect sample at home and bring with her to office tomorrow. Advised patient of importance of sterile collection so we can have definitive results as last culture showed contamination.   Patient is offered and declines appointment today in office with Dr. Talbert Nan or any physician.   Patient is scheduled for office visit with Dr. Sabra Heck tomorrow at 10:00 and she is advised to not wait and go to closest emergency room if she develops any increase in symptoms such as nausea, vomiting, unable to pass urine or any blood in urine. Patient confirms she is not having any emergency symptoms at this time and verbalized understanding of instructions.   Routing to Dr. Talbert Nan and Dr. Sabra Heck.  Will close encounter.

## 2015-08-08 NOTE — Telephone Encounter (Signed)
Patient called requesting appointment today with Dr Sabra Heck for a possible bladder infection. Patient states "i'm so uncomfortable I can't hardly sit down and my lower stomach hurts". Patient informed that Dr Sabra Heck is not available today and offered a nurse practitioner or other doctor. Patient stated she would rather wait until Friday to see Dr Sabra Heck. Discussed seeking care sooner than Friday if her symptoms are bothering her as stated. Patient states she can't void in our office when she would arrive and requests to bring in a sample. Patient again stated she would rather wait to see Dr Sabra Heck. Patient agreeable to nurse returning call to discuss.  Patient prefers home number for best contact.

## 2015-08-09 ENCOUNTER — Ambulatory Visit (INDEPENDENT_AMBULATORY_CARE_PROVIDER_SITE_OTHER): Payer: Medicare Other | Admitting: Obstetrics & Gynecology

## 2015-08-09 ENCOUNTER — Encounter: Payer: Self-pay | Admitting: Obstetrics & Gynecology

## 2015-08-09 VITALS — BP 116/54 | HR 56 | Temp 97.5°F | Resp 16 | Wt 128.2 lb

## 2015-08-09 DIAGNOSIS — I209 Angina pectoris, unspecified: Secondary | ICD-10-CM | POA: Diagnosis not present

## 2015-08-09 DIAGNOSIS — N3000 Acute cystitis without hematuria: Secondary | ICD-10-CM

## 2015-08-09 DIAGNOSIS — R300 Dysuria: Secondary | ICD-10-CM

## 2015-08-09 DIAGNOSIS — R102 Pelvic and perineal pain: Secondary | ICD-10-CM

## 2015-08-09 DIAGNOSIS — N9489 Other specified conditions associated with female genital organs and menstrual cycle: Secondary | ICD-10-CM

## 2015-08-09 MED ORDER — NITROFURANTOIN MONOHYD MACRO 100 MG PO CAPS: 100.0000 mg | ORAL_CAPSULE | Freq: Two times a day (BID) | ORAL | Status: DC

## 2015-08-09 NOTE — Progress Notes (Signed)
S:  81 y.o.  MWF here for complaint of increased pelvic pressure, increased dysuria, and some urinary hesitancy that seems to be getting worse over the past 7 days.  Denies fever.  Had chronic low back pain and this is not worse.  Pt feels like she is having some increased urinary incontinence as well.  Pt wasn't sure if this was a yeast infection or not so used a 3 day OTC monistat cream.  Reports she had some burning with this.  Symptoms did not improve.  Denies hematuria.    Review of Systems  All other systems reviewed and are negative.   Patient Active Problem List   Diagnosis Date Noted  . Bruit of left carotid artery 04/11/2013  . Vertigo 04/11/2013  . Recurrent UTI 09/01/2012  . Long term (current) use of anticoagulants 06/03/2011  . Angina pectoris ([place]) 05/28/2011  . Atrial fibrillation ([place]) 05/28/2011  . HTN (hypertension) 05/28/2011  . CAD (coronary artery disease) 05/28/2011  . Coronary artery disease   . Hyperlipidemia   . Anxiety   . DA (degenerative arthritis)   . Skin cancer   . Hearing loss   . Heartburn    Medications, Allergies, PMedHx, PSurg hx reviewed.  O:  Filed Vitals:   08/09/15 0950  BP: 116/54  Pulse: 56  Temp: 97.5 F (36.4 C)  Resp: 16  Weight: 128 lb 3.2 oz (58.151 kg)   Physical Exam  Constitutional: She appears well-developed and well-nourished.  GI: Soft. Bowel sounds are normal. She exhibits no mass. Tenderness: suprapubic. There is no rebound and no guarding.  Genitourinary: Vagina normal and uterus normal. There is no rash, tenderness, lesion or injury on the right labia. There is no rash, tenderness, lesion or injury on the left labia. Cervix exhibits no motion tenderness. Right adnexum displays no mass, no tenderness and no fullness. Left adnexum displays no mass, no tenderness and no fullness. No vaginal discharge found.  Lymphadenopathy:       Right: No inguinal adenopathy present.       Left: No inguinal adenopathy present.   Pt  could not void so using sterile technique, a cath in and out was performed to obtain urine sample.  Assessment:  Dysuria Female pelvic pressure H/O prior UTI  Plan: Macrobid 100mg  bid x 7 day Urine micro and culture pending.  Results will be called to the pt

## 2015-08-10 LAB — URINALYSIS, MICROSCOPIC ONLY
Casts: NONE SEEN [LPF]
Crystals: NONE SEEN [HPF]
SQUAMOUS EPITHELIAL / LPF: NONE SEEN [HPF] (ref <=5)
Yeast: NONE SEEN [HPF]

## 2015-08-12 LAB — URINE CULTURE

## 2015-08-13 NOTE — Addendum Note (Signed)
Addended by: Megan Salon on: 08/13/2015 06:03 PM   Modules accepted: Orders

## 2015-08-23 ENCOUNTER — Other Ambulatory Visit: Payer: Self-pay | Admitting: Cardiology

## 2015-08-27 ENCOUNTER — Telehealth: Payer: Self-pay | Admitting: Obstetrics & Gynecology

## 2015-08-27 ENCOUNTER — Other Ambulatory Visit: Payer: Self-pay | Admitting: Obstetrics & Gynecology

## 2015-08-27 ENCOUNTER — Telehealth: Payer: Self-pay | Admitting: Cardiology

## 2015-08-27 ENCOUNTER — Ambulatory Visit (INDEPENDENT_AMBULATORY_CARE_PROVIDER_SITE_OTHER): Payer: Medicare Other

## 2015-08-27 VITALS — BP 120/60 | HR 72 | Resp 14 | Ht 61.0 in | Wt 131.0 lb

## 2015-08-27 DIAGNOSIS — R300 Dysuria: Secondary | ICD-10-CM

## 2015-08-27 DIAGNOSIS — N3000 Acute cystitis without hematuria: Secondary | ICD-10-CM | POA: Diagnosis not present

## 2015-08-27 DIAGNOSIS — N390 Urinary tract infection, site not specified: Secondary | ICD-10-CM

## 2015-08-27 LAB — POCT URINALYSIS DIPSTICK
Bilirubin, UA: NEGATIVE
Ketones, UA: NEGATIVE
Nitrite, UA: POSITIVE
Protein, UA: NEGATIVE
Urobilinogen, UA: NEGATIVE
pH, UA: 7

## 2015-08-27 MED ORDER — NITROFURANTOIN MONOHYD MACRO 100 MG PO CAPS: 100.0000 mg | ORAL_CAPSULE | Freq: Two times a day (BID) | ORAL | Status: DC

## 2015-08-27 NOTE — Progress Notes (Signed)
Pt here for TOC of urine.   Pt c/o dysuria and lower abd pain still.   Urine dipped and sent for cx.   Per Dr. Sabra Heck, Rx was sent out and a referral to Urology.

## 2015-08-27 NOTE — Telephone Encounter (Signed)
Patient has an appointment for today for a nurse visit urine. She is having a hard time sitting and using the bathroom. She wants to know if she will need to chang her appointment.

## 2015-08-27 NOTE — Telephone Encounter (Signed)
Patient aware samples are at the front desk for pick up  She is in the donut hole, numbers to the ranexa and eliquis companies given to the pt.

## 2015-08-27 NOTE — Telephone Encounter (Signed)
Spoke with patient. Patient was last seen on 08/09/2015 with Dr.Miller and treated for a UTI with Macrobid. Reports she completed her full course of Macrobid and her symptoms went away. Now over the last two day symptoms have returned. Reports she is having discomfort with urination and difficulty emptying her bladder at times. Denies any lower back pain, fever, chills, or blood in urine. She is scheduled for a urine recheck today at 2 pm. Advised she will need to keep this appointment as scheduled as she may need further treatment. She is agreeable and will keep appointment as scheduled.  Routing to provider for final review. Patient agreeable to disposition. Will close encounter.

## 2015-08-27 NOTE — Telephone Encounter (Signed)
New message      Patient calling the office for samples of medication:   1.  What medication and dosage are you requesting samples for?Eliquis 5 mg po twice daily and Ranexa 500 mg po twice daily  2.  Are you currently out of this medication? Pt has just a few left

## 2015-08-29 LAB — URINE CULTURE

## 2015-09-07 DIAGNOSIS — N3020 Other chronic cystitis without hematuria: Secondary | ICD-10-CM | POA: Diagnosis not present

## 2015-09-13 ENCOUNTER — Telehealth: Payer: Self-pay

## 2015-09-13 NOTE — Telephone Encounter (Signed)
Tier reduction for Eliquis faxed to [employer] review at fax # [phone].

## 2015-09-14 ENCOUNTER — Telehealth: Payer: Self-pay | Admitting: Cardiology

## 2015-09-14 NOTE — Telephone Encounter (Signed)
Returned call to patient.Tier exception for Eliquis refaxed to [facility] at fax # [phone].Patient requested eliquis and ranexa samples.Advised [facility] office out of samples.[employer] office will leave Eliquis 5 mg samples at front desk.

## 2015-09-14 NOTE — Telephone Encounter (Signed)
Message routed to [facility], LPN to follow up on tier exception that was submitted 09/13/15

## 2015-09-14 NOTE — Telephone Encounter (Signed)
New message      Calling regarding the tiering exception form for eliquis that was sent to [place].  Please call

## 2015-09-14 NOTE — Telephone Encounter (Signed)
Follow up      Pt want to talk to the nurse about her eliquis and tier exception.

## 2015-09-20 ENCOUNTER — Telehealth: Payer: Self-pay

## 2015-09-20 NOTE — Telephone Encounter (Signed)
Received Ranexa [employer] forms from patient.Dr.Jordan signed and forms faxed to fax # [phone].

## 2015-09-26 ENCOUNTER — Other Ambulatory Visit: Payer: Self-pay | Admitting: Obstetrics & Gynecology

## 2015-09-26 NOTE — Telephone Encounter (Signed)
Medication refill request: Nystatin Cream Last AEX:   Next AEX:  Last MMG (if hormonal medication request):  Refill authorized: 03/30/15 #30g 3R. Please advise. Thank you.

## 2015-09-28 DIAGNOSIS — B9620 Unspecified Escherichia coli [E. coli] as the cause of diseases classified elsewhere: Secondary | ICD-10-CM | POA: Diagnosis not present

## 2015-09-28 DIAGNOSIS — N3020 Other chronic cystitis without hematuria: Secondary | ICD-10-CM | POA: Diagnosis not present

## 2015-09-28 DIAGNOSIS — N390 Urinary tract infection, site not specified: Secondary | ICD-10-CM | POA: Diagnosis not present

## 2015-09-28 DIAGNOSIS — N3000 Acute cystitis without hematuria: Secondary | ICD-10-CM | POA: Diagnosis not present

## 2015-10-02 ENCOUNTER — Other Ambulatory Visit: Payer: Self-pay

## 2015-10-02 ENCOUNTER — Telehealth: Payer: Self-pay | Admitting: Cardiology

## 2015-10-02 MED ORDER — RANOLAZINE ER 500 MG PO TB12: 500.0000 mg | ORAL_TABLET | Freq: Two times a day (BID) | ORAL | 3 refills | Status: DC

## 2015-10-02 NOTE — Telephone Encounter (Signed)
Spoke with Ranaxe connects and patient pre approved just needed Rx faxed  Faxed and advised patient She then requested Eliquis 5 mg sample [id number] EXP 9/19 placed at front desk Patient aware

## 2015-10-02 NOTE — Telephone Encounter (Signed)
New Message  Pt call requesting to speak with RN about receiving assistance for medication. Pt states a form was fax on her behalf . Pt wanted to f/u to see if it had been received. Please call back to discuss

## 2015-11-07 ENCOUNTER — Other Ambulatory Visit: Payer: Self-pay | Admitting: Cardiology

## 2015-11-09 ENCOUNTER — Telehealth: Payer: Self-pay | Admitting: *Deleted

## 2015-11-09 NOTE — Telephone Encounter (Signed)
Pt walked in office requesting samples of Eliquis 5mg .    Medication samples have been provided to the patient.  Drug name: Eliquis 5mg   Qty: 21 day supply LOT: [id number]  Exp.Date: 01/20  Silverio Lay 11:20 AM 11/09/2015

## 2015-11-23 ENCOUNTER — Other Ambulatory Visit: Payer: Self-pay | Admitting: Internal Medicine

## 2015-11-23 DIAGNOSIS — M545 Low back pain: Principal | ICD-10-CM

## 2015-11-23 DIAGNOSIS — G8929 Other chronic pain: Secondary | ICD-10-CM

## 2015-11-26 ENCOUNTER — Telehealth: Payer: Self-pay

## 2015-11-26 NOTE — Telephone Encounter (Signed)
Received clearance from [place].Dr.Jordan advised ok to hold Eliquis 2 days for epidural injection.

## 2015-12-05 ENCOUNTER — Other Ambulatory Visit: Payer: Self-pay | Admitting: Internal Medicine

## 2015-12-05 ENCOUNTER — Ambulatory Visit
Admission: RE | Admit: 2015-12-05 | Discharge: 2015-12-05 | Disposition: A | Discharge status: Home / Self Care | Payer: Medicare Other | Source: Ambulatory Visit | Attending: Internal Medicine | Admitting: Internal Medicine

## 2015-12-05 DIAGNOSIS — G8929 Other chronic pain: Secondary | ICD-10-CM

## 2015-12-05 DIAGNOSIS — M47817 Spondylosis without myelopathy or radiculopathy, lumbosacral region: Secondary | ICD-10-CM | POA: Diagnosis not present

## 2015-12-05 DIAGNOSIS — M545 Low back pain, unspecified: Secondary | ICD-10-CM

## 2015-12-05 MED ORDER — METHYLPREDNISOLONE ACETATE 40 MG/ML INJ SUSP (RADIOLOG
120.0000 mg | Freq: Once | INTRAMUSCULAR | Status: AC
  Administered 2015-12-05: 120 mg via EPIDURAL

## 2015-12-05 MED ORDER — IOPAMIDOL (ISOVUE-M 200) INJECTION 41%
1.0000 mL | Freq: Once | INTRAMUSCULAR | Status: AC
  Administered 2015-12-05: 1 mL via EPIDURAL

## 2015-12-05 NOTE — Discharge Instructions (Signed)

## 2015-12-14 ENCOUNTER — Telehealth: Payer: Self-pay | Admitting: Cardiology

## 2015-12-14 MED ORDER — APIXABAN 5 MG PO TABS: 5.0000 mg | ORAL_TABLET | Freq: Two times a day (BID) | ORAL | 0 refills | Status: DC

## 2015-12-14 NOTE — Telephone Encounter (Signed)
Patient calling the office for samples of medication:   1.  What medication and dosage are you requesting samples for? Eliquis 5 mg,she takes 2 a day  2.  Are you currently out of this medication? Pt is in the donut hole

## 2015-12-14 NOTE — Telephone Encounter (Signed)
SPOKE TO PATIENT  SAMPLE (1 BOX) IS AVAILABLE TO PICK UP PATIENT VERBALIZED UNDERSTANDING

## 2015-12-24 ENCOUNTER — Telehealth: Payer: Self-pay | Admitting: Cardiology

## 2015-12-24 NOTE — Telephone Encounter (Signed)
Returned call to patient Eliquis 5 mg samples left at [facility] office front desk. 

## 2015-12-24 NOTE — Telephone Encounter (Signed)
New Message  Patient calling the office for samples of medication:   1.  What medication and dosage are you requesting samples for? Eliquis 5mg   2.  Are you currently out of this medication? Per pt is out of med.. Please call back to discuss

## 2015-12-31 DIAGNOSIS — Z23 Encounter for immunization: Secondary | ICD-10-CM | POA: Diagnosis not present

## 2016-01-11 ENCOUNTER — Telehealth: Payer: Self-pay

## 2016-01-11 NOTE — Telephone Encounter (Signed)
Received patient assistance forms from patient for Eliquis.Forms completed and faxed to fax # [phone].

## 2016-01-14 ENCOUNTER — Telehealth: Payer: Self-pay | Admitting: Cardiology

## 2016-01-14 MED ORDER — APIXABAN 5 MG PO TABS: 5.0000 mg | ORAL_TABLET | Freq: Two times a day (BID) | ORAL | 3 refills | Status: DC

## 2016-01-14 NOTE — Telephone Encounter (Signed)
Returned call to [employer] requesting Eliquis  prescription to be faxed to [phone].Record # [id number].

## 2016-01-14 NOTE — Telephone Encounter (Signed)
Needs hard copy and physical signature

## 2016-01-14 NOTE — Telephone Encounter (Signed)
OK to prescribe. I can sign Thursday  Demitrios Molyneux [place] MD, [facility]

## 2016-01-14 NOTE — Telephone Encounter (Signed)
New message  [facility] squib patient assistance needs rx faxed for pt  1. eliquis 5mg  1 tab/daily 2. [place] squib pt asst 3. rx needs to be quantity of 60 or 180   Must be signed by dr [place]

## 2016-01-17 ENCOUNTER — Telehealth: Payer: Self-pay | Admitting: Cardiology

## 2016-01-17 NOTE — Telephone Encounter (Signed)
Eliquis prescription refaxed to patient assistance at fax # [phone]

## 2016-01-17 NOTE — Telephone Encounter (Signed)
Eliquis 5 mg prescription with record # [id number] faxed to patient assistance at fax # [phone].

## 2016-01-17 NOTE — Telephone Encounter (Signed)
New Message  Neoma Laming voiced MD-Jordan faxed over application for pt for a medication but never sent over prescription with application.  Fax number where prescription will need to be faxed: [phone]

## 2016-01-22 ENCOUNTER — Telehealth: Payer: Self-pay | Admitting: Cardiology

## 2016-01-22 NOTE — Telephone Encounter (Signed)
Patient calling the office for samples of medication:   1.  What medication and dosage are you requesting samples for? eliquis 5mg  2x day  2.  Are you currently out of this medication? 3 pills left

## 2016-01-22 NOTE — Telephone Encounter (Signed)
Notified that we have no samples she states that pt assistance is still working on getting medication for her. She only has 3 pills left, what should she do now?

## 2016-01-28 ENCOUNTER — Telehealth: Payer: Self-pay | Admitting: Cardiology

## 2016-01-28 ENCOUNTER — Other Ambulatory Visit: Payer: Self-pay | Admitting: Cardiology

## 2016-01-28 MED ORDER — APIXABAN 5 MG PO TABS: 5.0000 mg | ORAL_TABLET | Freq: Two times a day (BID) | ORAL | 6 refills | Status: DC

## 2016-01-28 MED ORDER — RANOLAZINE ER 500 MG PO TB12
500.0000 mg | ORAL_TABLET | Freq: Two times a day (BID) | ORAL | 0 refills | Status: DC
Start: 2016-01-28 — End: 2016-04-21

## 2016-01-28 NOTE — Telephone Encounter (Signed)
°  Follow up   Pt has some additional questions regarding her Eliquis and Renexa mediation. Please call.

## 2016-01-28 NOTE — Telephone Encounter (Signed)
REFILL 

## 2016-01-28 NOTE — Telephone Encounter (Signed)
Returned call to patient.She stated she qualifies for Ranexa patient assistance if she spends $ 238.00 more.Stated she needs refill for Ranexa sent to her pharmacy.Ranexa refill sent to pharmacy.

## 2016-01-28 NOTE — Telephone Encounter (Signed)
Spoke to patient.She stated she is trying to get patient assistance for Eliquis.Stated she is already receiving patient assistance for Ranexa.She needs to spend $ 238 more to receive patient assistance for Eliquis.She is requesting Eliquis refill.Refill sent to pharmacy.

## 2016-01-28 NOTE — Telephone Encounter (Signed)
New message     Patient calling wants the nurse to discuss with Dr. [place] on renexa -  qualified eliquis assistance.

## 2016-01-29 ENCOUNTER — Other Ambulatory Visit: Payer: Self-pay | Admitting: Cardiology

## 2016-01-29 ENCOUNTER — Telehealth: Payer: Self-pay | Admitting: Cardiology

## 2016-01-29 NOTE — Telephone Encounter (Signed)
Pt called back, saying she missed a call about 30 min ago, someone was on her phone and she couldn't answer-pls call back

## 2016-01-29 NOTE — Telephone Encounter (Signed)
Returned call to patient.Eliquis 2.5 mg samples will be left at [facility] office.Advised take 2 tablets to equal 5 mg.

## 2016-02-04 ENCOUNTER — Telehealth: Payer: Self-pay | Admitting: Cardiology

## 2016-02-04 NOTE — Telephone Encounter (Signed)
New message  Pt is returning call concerning Eliquis to [name]  Please call back

## 2016-02-04 NOTE — Telephone Encounter (Signed)
F/U  Patient is returning a call from Jay

## 2016-02-04 NOTE — Telephone Encounter (Signed)
Returned call to patient no answer.LMTC. 

## 2016-02-08 ENCOUNTER — Other Ambulatory Visit: Payer: Self-pay | Admitting: Cardiology

## 2016-02-08 NOTE — Telephone Encounter (Signed)
Rx(s) sent to pharmacy electronically.  

## 2016-02-11 NOTE — Telephone Encounter (Signed)
Returned call to patient 02/04/16.Eliquis 5 mg samples left at [facility] office front desk.

## 2016-04-21 ENCOUNTER — Other Ambulatory Visit: Payer: Self-pay | Admitting: Cardiology

## 2016-04-21 MED ORDER — RANOLAZINE ER 500 MG PO TB12: 500.0000 mg | ORAL_TABLET | Freq: Two times a day (BID) | ORAL | 0 refills | Status: DC

## 2016-04-21 NOTE — Telephone Encounter (Signed)
New Message     *STAT* If patient is at the pharmacy, call can be transferred to refill team.   1. Which medications need to be refilled? (please list name of each medication and dose if known)  ranolazine (RANEXA) 500 MG 12 hr tablet Take 1 tablet (500 mg total) by mouth 2 (two) times daily.     2. Which pharmacy/location (including street and city if local pharmacy) is medication to be sent [facility] [place]   3. Do they need a 30 day or 90 day supply? [place]

## 2016-04-21 NOTE — Telephone Encounter (Signed)
Rx(s) sent to pharmacy electronically.  

## 2016-05-15 ENCOUNTER — Other Ambulatory Visit: Payer: Self-pay | Admitting: Cardiology

## 2016-05-15 DIAGNOSIS — I2510 Atherosclerotic heart disease of native coronary artery without angina pectoris: Secondary | ICD-10-CM

## 2016-06-09 ENCOUNTER — Telehealth: Payer: Self-pay | Admitting: Cardiology

## 2016-06-09 NOTE — Telephone Encounter (Signed)
Returned call to patient she stated she needs Dr.Jordan to renew handicapp parking.Advised I will have Dr.Jordan sign form next week when he is back in office.I will mail to you.

## 2016-06-09 NOTE — Telephone Encounter (Signed)
Ms. Treadwell is calling states that she has a document for Dr. [place] to sign and would like to know if he would sign it.Thanks.

## 2016-06-17 NOTE — Telephone Encounter (Signed)
Handicapp parking form signed by Dr.Jordan and mailed to patient.

## 2016-07-25 ENCOUNTER — Other Ambulatory Visit: Payer: Self-pay | Admitting: Cardiology

## 2016-07-25 NOTE — Telephone Encounter (Signed)
Rx has been sent to the pharmacy electronically. ° °

## 2016-07-29 ENCOUNTER — Other Ambulatory Visit: Payer: Self-pay | Admitting: Cardiology

## 2016-08-14 ENCOUNTER — Other Ambulatory Visit: Payer: Self-pay

## 2016-08-14 MED ORDER — RANOLAZINE ER 500 MG PO TB12: 500.0000 mg | ORAL_TABLET | Freq: Two times a day (BID) | ORAL | 3 refills | Status: DC

## 2016-08-17 DIAGNOSIS — Z7901 Long term (current) use of anticoagulants: Secondary | ICD-10-CM | POA: Diagnosis not present

## 2016-08-17 DIAGNOSIS — S0993XA Unspecified injury of face, initial encounter: Secondary | ICD-10-CM | POA: Diagnosis not present

## 2016-08-17 DIAGNOSIS — I482 Chronic atrial fibrillation: Secondary | ICD-10-CM | POA: Diagnosis not present

## 2016-08-17 DIAGNOSIS — W06XXXA Fall from bed, initial encounter: Secondary | ICD-10-CM | POA: Diagnosis not present

## 2016-08-18 ENCOUNTER — Emergency Department (HOSPITAL_COMMUNITY)
Admission: EM | Admit: 2016-08-18 | Discharge: 2016-08-18 | Disposition: A | Discharge status: Home / Self Care | Payer: Medicare Other | Attending: Emergency Medicine | Admitting: Emergency Medicine

## 2016-08-18 ENCOUNTER — Emergency Department (HOSPITAL_COMMUNITY): Payer: Medicare Other

## 2016-08-18 ENCOUNTER — Encounter (HOSPITAL_COMMUNITY): Payer: Self-pay | Admitting: Emergency Medicine

## 2016-08-18 DIAGNOSIS — S199XXA Unspecified injury of neck, initial encounter: Secondary | ICD-10-CM | POA: Diagnosis not present

## 2016-08-18 DIAGNOSIS — Z951 Presence of aortocoronary bypass graft: Secondary | ICD-10-CM | POA: Insufficient documentation

## 2016-08-18 DIAGNOSIS — Z85828 Personal history of other malignant neoplasm of skin: Secondary | ICD-10-CM | POA: Diagnosis not present

## 2016-08-18 DIAGNOSIS — Y9289 Other specified places as the place of occurrence of the external cause: Secondary | ICD-10-CM | POA: Insufficient documentation

## 2016-08-18 DIAGNOSIS — S0083XA Contusion of other part of head, initial encounter: Secondary | ICD-10-CM | POA: Insufficient documentation

## 2016-08-18 DIAGNOSIS — W06XXXA Fall from bed, initial encounter: Secondary | ICD-10-CM | POA: Diagnosis not present

## 2016-08-18 DIAGNOSIS — I10 Essential (primary) hypertension: Secondary | ICD-10-CM | POA: Insufficient documentation

## 2016-08-18 DIAGNOSIS — I2510 Atherosclerotic heart disease of native coronary artery without angina pectoris: Secondary | ICD-10-CM | POA: Insufficient documentation

## 2016-08-18 DIAGNOSIS — S0990XA Unspecified injury of head, initial encounter: Secondary | ICD-10-CM

## 2016-08-18 DIAGNOSIS — Z7901 Long term (current) use of anticoagulants: Secondary | ICD-10-CM | POA: Insufficient documentation

## 2016-08-18 DIAGNOSIS — I6200 Nontraumatic subdural hemorrhage, unspecified: Secondary | ICD-10-CM | POA: Diagnosis not present

## 2016-08-18 DIAGNOSIS — Y9389 Activity, other specified: Secondary | ICD-10-CM | POA: Diagnosis not present

## 2016-08-18 DIAGNOSIS — Y999 Unspecified external cause status: Secondary | ICD-10-CM | POA: Insufficient documentation

## 2016-08-18 DIAGNOSIS — Z79899 Other long term (current) drug therapy: Secondary | ICD-10-CM | POA: Diagnosis not present

## 2016-08-18 NOTE — ED Notes (Signed)
Obtained order from Dr. Johnney Killian for Head CT

## 2016-08-18 NOTE — ED Triage Notes (Signed)
Pt reports falling out of bed on Saturday night, hitting face on nightstand. Pt denies dizziness, AMS, sudden HA. Pt reports taking blood thinners. Extensive bruising and swelling noted to L forehead, L face. AOx4, NAD noted.

## 2016-08-18 NOTE — ED Provider Notes (Signed)
[facility] DEPT Provider Note   CSN: [id number] Arrival date & time: 08/18/16  1141  By signing my name below, I, Jeanell Sparrow, attest that this documentation has been prepared under the direction and in the presence of Malvin Johns, MD. Electronically Signed: Jeanell Sparrow, Scribe. 08/18/2016. 1:06 PM.  History   Chief Complaint Chief Complaint  Patient presents with  . Fall  . Head Injury   The history is provided by the patient. No language interpreter was used.   HPI Comments: [name] is a 82 y.o. female who presents to the Emergency Department complaining of fall that occurred 2 days ago. She states she fell out of bed and hit her face on a nightstand after having a nightmare. No LOC. She went to her PCP yesterday and was advised to come to the ED.  She is currently taking Eliquis. She reports associated associated left-sided facial color change with swelling. She also has nausea. Denies any visual disturbance, abdominal pain, vomiting, or other complaints at this time.  Past Medical History:  Diagnosis Date  . Anxiety   . Atrial fibrillation ([place])   . Coronary artery disease    STATUS POST CABG  . DA (degenerative arthritis)   . Hearing loss   . Heartburn   . Hyperlipidemia   . Hypertension   . Ovarian cyst    h/o complex right ovarian cyst/followed yearly  . Skin cancer     Patient Active Problem List   Diagnosis Date Noted  . Bruit of left carotid artery 04/11/2013  . Vertigo 04/11/2013  . Recurrent UTI 09/01/2012  . Long term (current) use of anticoagulants 06/03/2011  . Angina pectoris ([place]) 05/28/2011  . Atrial fibrillation ([place]) 05/28/2011  . HTN (hypertension) 05/28/2011  . CAD (coronary artery disease) 05/28/2011  . Coronary artery disease   . Hyperlipidemia   . Anxiety   . DA (degenerative arthritis)   . Skin cancer   . Hearing loss   . Heartburn     Past Surgical History:  Procedure Laterality Date  . CARDIAC CATHETERIZATION   04/16/2007   NORMAL LEFT VENTRICULAR SIZE AND CONTRACTILITY WITH NORMAL SYSTOLIC FUNCTION. EF 60%. THERE IS MILD TO MODERATE MITRAL INSUFFICIENCY  . CHOLECYSTECTOMY    . CORONARY ARTERY BYPASS GRAFT  1986   X3, LIMA GRAFT TO THE LAD. SHE HAS POOR TARGETS  . HYSTEROSCOPY  8/04   polyp resection  . NEPHRECTOMY     LEFT  . SKIN CANCER EXCISION    . TONSILLECTOMY    . TUBAL LIGATION      OB History    Gravida Para Term Preterm AB Living   1 1       1    SAB TAB Ectopic Multiple Live Births                   Home Medications    Prior to Admission medications   Medication Sig Start Date End Date Taking? Authorizing Provider  amLODipine (NORVASC) 5 MG tablet Take 1 tablet (5 mg total) by mouth daily. 01/30/16   [place], Peter M, MD  apixaban (ELIQUIS) 5 MG TABS tablet Take 1 tablet (5 mg total) by mouth 2 (two) times daily. 12/14/15   [place], Peter M, MD  atorvastatin (LIPITOR) 40 MG tablet TAKE 1 TABLET BY MOUTH AT BEDTIME 01/30/16   [place], Peter M, MD  ELIQUIS 5 MG TABS tablet Take 1 tablet (5 mg total) by mouth 2 (two) times daily. 07/25/16   [place],  Ander Slade, MD  isosorbide mononitrate (IMDUR) 60 MG 24 hr tablet Take 1 tablet (60 mg total) by mouth daily. 07/29/16   [place], Peter M, MD  Lansoprazole (PREVACID PO) Take by mouth daily.      [provider]  losartan (COZAAR) 100 MG tablet Take 1 tablet (100 mg total) by mouth daily. 04/29/12 06/11/13  Martin[place], Peter M, MD  metoprolol (LOPRESSOR) 50 MG tablet Take 0.5 tablets (25 mg total) by mouth 2 (two) times daily. 05/15/16   [place], Peter M, MD  nitrofurantoin, macrocrystal-monohydrate, (MACROBID) 100 MG capsule Take 1 capsule (100 mg total) by mouth 2 (two) times daily. Take one capsule BID x 10 days 08/27/15   Megan Salon, MD  NITROSTAT 0.4 MG SL tablet Reported on 08/09/2015 05/18/15   [provider]  nystatin cream (MYCOSTATIN) APPLY TO AFFECTED AREA TWICE DAILY FOR 7 DAYS 09/27/15   Megan Salon, MD  Omega-3 Fatty  Acids (FISH OIL) 1000 MG CAPS Take by mouth daily.      [provider]  ranolazine (RANEXA) 500 MG 12 hr tablet Take 1 tablet (500 mg total) by mouth 2 (two) times daily. 08/14/16   [place], Peter M, MD  trimethoprim (TRIMPEX) 100 MG tablet Take 1 tablet (100 mg total) by mouth daily. 03/30/15   Megan Salon, MD    Family History Family History  Problem Relation Age of Onset  . Hypertension Father   . Breast cancer Sister     Social History Social History  Substance Use Topics  . Smoking status: Never Smoker  . Smokeless tobacco: Never Used  . Alcohol use No     Allergies   Demerol; Niacin; Niaspan [niacin er]; Sulfa antibiotics; and Tape   Review of Systems Review of Systems  Constitutional: Negative for chills, diaphoresis, fatigue and fever.  HENT: Positive for facial swelling (Left-sided). Negative for congestion, rhinorrhea and sneezing.   Eyes: Negative.  Negative for visual disturbance.  Respiratory: Negative for cough, chest tightness and shortness of breath.   Cardiovascular: Negative for chest pain and leg swelling.  Gastrointestinal: Negative for abdominal pain, blood in stool, diarrhea, nausea and vomiting.  Genitourinary: Negative for difficulty urinating, flank pain, frequency and hematuria.  Musculoskeletal: Negative for arthralgias and back pain.  Skin: Positive for color change (Left-sided facial). Negative for rash.  Neurological: Negative for dizziness, syncope, speech difficulty, weakness, numbness and headaches.  Hematological: Bruises/bleeds easily.     Physical Exam Updated Vital Signs BP (!) 157/78 (BP Location: Right Arm)   Pulse 65   Temp 97.9 F (36.6 C) (Oral)   Resp 14   Ht 5\' 1"  (1.549 m)   Wt 136 lb (61.7 kg)   LMP 03/03/1992   SpO2 96%   BMI 25.70 kg/m   Physical Exam  Constitutional: She is oriented to person, place, and time. She appears well-developed and well-nourished.  HENT:  Head: Normocephalic.  Ecchymosis to  the left perioridbital area and left maxilla. TTP over the maxilla,  Eyes: EOM are normal. Pupils are equal, round, and reactive to light.  No eye redness.   Neck: Normal range of motion. Neck supple.  Cardiovascular: Normal rate, regular rhythm and normal heart sounds.   Pulmonary/Chest: Effort normal and breath sounds normal. No respiratory distress. She has no wheezes. She has no rales. She exhibits no tenderness.  Abdominal: Soft. Bowel sounds are normal. There is no tenderness. There is no rebound and no guarding.  Musculoskeletal: Normal range of motion. She  exhibits no edema.  No TTP or pain with ROM of the extremities. No cervical, thoracic, or lumbar spine tenderness.   Lymphadenopathy:    She has no cervical adenopathy.  Neurological: She is alert and oriented to person, place, and time.  Skin: Skin is warm and dry. No rash noted.  Psychiatric: She has a normal mood and affect.     ED Treatments / Results  DIAGNOSTIC STUDIES: Oxygen Saturation is 96% on RA, normal by my interpretation.    COORDINATION OF CARE: 1:10 PM- Pt advised of plan for treatment and pt agrees.  Labs (all labs ordered are listed, but only abnormal results are displayed) Labs Reviewed - No data to display  EKG  EKG Interpretation None       Radiology Ct Head Wo Contrast  Result Date: 08/18/2016 CLINICAL DATA:  The patient fell out of bed with a blow to the head the night of 08/16/2016. EXAM: CT HEAD WITHOUT CONTRAST TECHNIQUE: Contiguous axial images were obtained from the base of the skull through the vertex without intravenous contrast. COMPARISON:  None. FINDINGS: Brain: Patchy and confluent hypoattenuation in the subcortical and periventricular deep white matter is consistent with chronic microvascular ischemic change. There is mild cortical atrophy. No evidence of hemorrhage, infarct, mass lesion, mass effect, midline shift or abnormal extra-axial fluid collection. No hydrocephalus or  pneumocephalus. Vascular: Atherosclerosis noted. Skull: Intact. Sinuses/Orbits: No acute abnormality. Other: Large hematoma about the left eye is identified. IMPRESSION: Large hematoma about the left eye without underlying fracture. No acute intracranial abnormality. Atrophy and extensive chronic microvascular ischemic change. Electronically Signed   By: Inge Rise M.D.   On: 08/18/2016 12:45   Ct Cervical Spine Wo Contrast  Result Date: 08/18/2016 CLINICAL DATA:  Facial and head trauma secondary to a fall today. The patient struck the left side of the face on the left side of the forehead and now has bruising and swelling. EXAM: CT MAXILLOFACIAL WITHOUT CONTRAST CT CERVICAL SPINE WITHOUT CONTRAST TECHNIQUE: Multidetector CT imaging of the maxillofacial structures was performed. Multiplanar CT image reconstructions were also generated. A small metallic BB was placed on the right temple in order to reliably differentiate right from left. Multidetector CT imaging of the cervical spine was performed without intravenous contrast. Multiplanar CT image reconstructions were also generated. COMPARISON:  None. FINDINGS: CT MAXILLOFACIAL FINDINGS Osseous: No fracture or mandibular dislocation. No destructive process. Orbits: Negative. No traumatic or inflammatory finding. Sinuses: Clear. Soft tissues: Prominent soft tissue contusion over the left cheek. No definable hematoma at that site. There is a soft tissue hematoma overlying left frontal bone. Prominent soft tissue swelling adjacent to the left inferior orbital rim Limited intracranial: Negative CT CERVICAL FINDINGS Alignment: No acute abnormalities. Skull base and vertebrae: Moderately severe degenerative facet arthritis at C2-3 through C4-5 on the left and C7-T1 on the left and C3-4 through C5-6 on the right and to a slightly lesser degree at C6-7 and C7-T1 on the right. Calcifications in the distal vertebral arteries. Soft tissues and spinal canal: No  prevertebral fluid or swelling. No visible canal hematoma. Disc levels: Diffuse degenerative disc disease in the cervical spine without neural impingement. Upper chest: Negative except for extensive aortic atherosclerosis. Other: None IMPRESSION: 1. Soft tissue contusion of the left cheek. Soft tissue hematoma over the left frontal bone. 2. No acute bone abnormality of the facial bones. 3. No acute abnormality of the cervical spine. Multilevel degenerative disc and joint disease. 4. Aortic atherosclerosis. Electronically Signed   By:  Lorriane Shire M.D.   On: 08/18/2016 15:06   Ct Maxillofacial Wo Cm  Result Date: 08/18/2016 CLINICAL DATA:  Facial and head trauma secondary to a fall today. The patient struck the left side of the face on the left side of the forehead and now has bruising and swelling. EXAM: CT MAXILLOFACIAL WITHOUT CONTRAST CT CERVICAL SPINE WITHOUT CONTRAST TECHNIQUE: Multidetector CT imaging of the maxillofacial structures was performed. Multiplanar CT image reconstructions were also generated. A small metallic BB was placed on the right temple in order to reliably differentiate right from left. Multidetector CT imaging of the cervical spine was performed without intravenous contrast. Multiplanar CT image reconstructions were also generated. COMPARISON:  None. FINDINGS: CT MAXILLOFACIAL FINDINGS Osseous: No fracture or mandibular dislocation. No destructive process. Orbits: Negative. No traumatic or inflammatory finding. Sinuses: Clear. Soft tissues: Prominent soft tissue contusion over the left cheek. No definable hematoma at that site. There is a soft tissue hematoma overlying left frontal bone. Prominent soft tissue swelling adjacent to the left inferior orbital rim Limited intracranial: Negative CT CERVICAL FINDINGS Alignment: No acute abnormalities. Skull base and vertebrae: Moderately severe degenerative facet arthritis at C2-3 through C4-5 on the left and C7-T1 on the left and C3-4  through C5-6 on the right and to a slightly lesser degree at C6-7 and C7-T1 on the right. Calcifications in the distal vertebral arteries. Soft tissues and spinal canal: No prevertebral fluid or swelling. No visible canal hematoma. Disc levels: Diffuse degenerative disc disease in the cervical spine without neural impingement. Upper chest: Negative except for extensive aortic atherosclerosis. Other: None IMPRESSION: 1. Soft tissue contusion of the left cheek. Soft tissue hematoma over the left frontal bone. 2. No acute bone abnormality of the facial bones. 3. No acute abnormality of the cervical spine. Multilevel degenerative disc and joint disease. 4. Aortic atherosclerosis. Electronically Signed   By: Lorriane Shire M.D.   On: 08/18/2016 15:06    Procedures Procedures (including critical care time)  Medications Ordered in ED Medications - No data to display   Initial Impression / Assessment and Plan / ED Course  I have reviewed the triage vital signs and the nursing notes.  Pertinent labs & imaging results that were available during my care of the patient were reviewed by me and considered in my medical decision making (see chart for details).     Patient presents after a fall out of bed with an head injury. She is on Eliquis. There is no evidence of intracranial hemorrhage. No facial fractures or spinal injuries are noted. She denies any other complaints of pain. She is neurologically intact. She was discharged home in good condition. Symptomatic care instructions were given. She was advised to follow-up with her PCP as needed or return here as needed for any worsening symptoms.  Final Clinical Impressions(s) / ED Diagnoses   Final diagnoses:  Injury of head, initial encounter  Contusion of face, initial encounter    New Prescriptions New Prescriptions   No medications on file   I personally performed the services described in this documentation, which was scribed in my presence.   The recorded information has been reviewed and considered.     Malvin Johns, MD 08/18/16 [phone]

## 2016-08-22 ENCOUNTER — Telehealth: Payer: Self-pay

## 2016-08-22 DIAGNOSIS — I480 Paroxysmal atrial fibrillation: Secondary | ICD-10-CM | POA: Diagnosis not present

## 2016-08-22 DIAGNOSIS — Z6826 Body mass index (BMI) 26.0-26.9, adult: Secondary | ICD-10-CM | POA: Diagnosis not present

## 2016-08-22 DIAGNOSIS — R2681 Unsteadiness on feet: Secondary | ICD-10-CM | POA: Diagnosis not present

## 2016-08-22 DIAGNOSIS — I2510 Atherosclerotic heart disease of native coronary artery without angina pectoris: Secondary | ICD-10-CM | POA: Diagnosis not present

## 2016-08-22 DIAGNOSIS — Z1389 Encounter for screening for other disorder: Secondary | ICD-10-CM | POA: Diagnosis not present

## 2016-08-22 DIAGNOSIS — I10 Essential (primary) hypertension: Secondary | ICD-10-CM | POA: Diagnosis not present

## 2016-08-22 DIAGNOSIS — H05232 Hemorrhage of left orbit: Secondary | ICD-10-CM | POA: Diagnosis not present

## 2016-08-22 NOTE — Telephone Encounter (Signed)
Received patient assistance from patient for Ranexa [facility].Form completed and faxed to fax # [phone].

## 2016-09-03 NOTE — Progress Notes (Signed)
[name] Date of Birth: 10-27-1934   History of Present Illness: [name] is seen for yearly follow up of CAD. She has a history of coronary disease status post CABG in 1986. Cardiac catheterization 2009 showed the LIMA graft to LAD was patent but her other grafts were occluded. She had no suitable vessels for PCI or bypass. She did have an episode of atrial fibrillation March 2013 and has been on chronic anticoagulation. She had an excellent response to Ranexa.   She was seen in the ED on June 18 after falling out of bed and hitting her head. She had a large hematoma around her left eye but no fracture or IC hematoma.   On follow up today she continues to do very well from a cardiac standpoint. She denies any chest pain, dyspnea, palpitations or edema. She is normally active.   Current Outpatient Prescriptions on File Prior to Visit  Medication Sig Dispense Refill  . amLODipine (NORVASC) 5 MG tablet Take 1 tablet (5 mg total) by mouth daily. 90 tablet 3  . apixaban (ELIQUIS) 5 MG TABS tablet Take 1 tablet (5 mg total) by mouth 2 (two) times daily. 14 tablet 0  . atorvastatin (LIPITOR) 40 MG tablet TAKE 1 TABLET BY MOUTH AT BEDTIME 90 tablet 3  . ELIQUIS 5 MG TABS tablet Take 1 tablet (5 mg total) by mouth 2 (two) times daily. 60 tablet 0  . isosorbide mononitrate (IMDUR) 60 MG 24 hr tablet Take 1 tablet (60 mg total) by mouth daily. 90 tablet 1  . Lansoprazole (PREVACID PO) Take by mouth daily.      . metoprolol (LOPRESSOR) 50 MG tablet Take 0.5 tablets (25 mg total) by mouth 2 (two) times daily. 90 tablet 0  . nitrofurantoin, macrocrystal-monohydrate, (MACROBID) 100 MG capsule Take 1 capsule (100 mg total) by mouth 2 (two) times daily. Take one capsule BID x 10 days 20 capsule 0  . NITROSTAT 0.4 MG SL tablet Reported on 08/09/2015    . nystatin cream (MYCOSTATIN) APPLY TO AFFECTED AREA TWICE DAILY FOR 7 DAYS 30 g 1  . Omega-3 Fatty Acids (FISH OIL) 1000 MG CAPS Take by mouth daily.       . ranolazine (RANEXA) 500 MG 12 hr tablet Take 1 tablet (500 mg total) by mouth 2 (two) times daily. 180 tablet 3  . trimethoprim (TRIMPEX) 100 MG tablet Take 1 tablet (100 mg total) by mouth daily. 90 tablet 4  . losartan (COZAAR) 100 MG tablet Take 1 tablet (100 mg total) by mouth daily. 90 tablet 3   No current facility-administered medications on file prior to visit.     Allergies  Allergen Reactions  . Demerol Nausea And Vomiting  . Niacin   . Niaspan [Niacin Er] Other (See Comments)    Facial flushing  . Sulfa Antibiotics Other (See Comments)    Causes weakness  . Tape Itching and Other (See Comments)    Burns, too    Past Medical History:  Diagnosis Date  . Anxiety   . Atrial fibrillation ([place])   . Coronary artery disease    STATUS POST CABG  . DA (degenerative arthritis)   . Hearing loss   . Heartburn   . Hyperlipidemia   . Hypertension   . Ovarian cyst    h/o complex right ovarian cyst/followed yearly  . Skin cancer     Past Surgical History:  Procedure Laterality Date  . CARDIAC CATHETERIZATION  04/16/2007   NORMAL LEFT VENTRICULAR  SIZE AND CONTRACTILITY WITH NORMAL SYSTOLIC FUNCTION. EF 60%. THERE IS MILD TO MODERATE MITRAL INSUFFICIENCY  . CHOLECYSTECTOMY    . CORONARY ARTERY BYPASS GRAFT  1986   X3, LIMA GRAFT TO THE LAD. SHE HAS POOR TARGETS  . HYSTEROSCOPY  8/04   polyp resection  . NEPHRECTOMY     LEFT  . SKIN CANCER EXCISION    . TONSILLECTOMY    . TUBAL LIGATION      History  Smoking Status  . Never Smoker  Smokeless Tobacco  . Never Used    History  Alcohol Use No    Family History  Problem Relation Age of Onset  . Hypertension Father   . Breast cancer Sister     Review of Systems: As noted in history of present illness.  All other systems were reviewed and are negative.  Physical Exam: BP 122/70   Pulse 66   Ht 5\' 1"  (1.549 m)   Wt 136 lb (61.7 kg)   LMP 03/03/1992   BMI 25.70 kg/m  She is a pleasant white female in  no acute distress. Her HEENT exam reveals bruising in her left face and neck with hematoma above left eye.  Chronic alopecia. Pupils are equal round and reactive to light sclera are clear. Oropharynx is clear. She has no JVD. Left carotid bruit. No adenopathy or thyromegaly. Lungs are clear. Cardiac exam reveals a regular rate and rhythm without gallop or murmur. Abdomen is soft with some tenderness left mid abdomen. No rebound. No masses or bruits. She has no edema. Pedal pulses are good. Skin is warm and dry. She is alert oriented x3. Cranial nerves II through XII are intact.  LABORATORY DATA: Lab Results  Component Value Date   WBC 9.1 03/30/2015   HGB 14.9 03/30/2015   HCT 41.9 03/30/2015   PLT 183 03/30/2015   GLUCOSE 91 07/19/2015   CHOL 169 06/12/2014   TRIG 176 (H) 06/12/2014   HDL 36 (L) 06/12/2014   LDLCALC 98 06/12/2014   ALT 17 07/19/2015   AST 20 07/19/2015   NA 139 07/19/2015   K 4.3 07/19/2015   CL 102 07/19/2015   CREATININE 0.75 07/19/2015   BUN 13 07/19/2015   CO2 26 07/19/2015   TSH 2.04 07/19/2015   INR 2.2 05/21/2012   HGBA1C  04/16/2007    5.6 (NOTE)   The ADA recommends the following therapeutic goals for glycemic   control related to Hgb A1C measurement:   Goal of Therapy:   < 7.0% Hgb A1C   Action Suggested:  > 8.0% Hgb A1C   Ref:  Diabetes Care, 22, Suppl. 1, 1999   Ecg today shows NSR with rate 66.  Normal Ecg.  I have personally reviewed and interpreted this study.   Assessment / Plan: 1. Coronary disease with stable class 1-2 angina. She is status post CABG in 1986. Prior cardiac catheterization in 2009 showed a patent LIMA to the LAD but her other grafts were occluded. She did not have any suitable vessels for PCI or redo bypass. She is on aggressive antianginal therapy including amlodipine, nitrates, metoprolol, and Ranexa. We will continue.  2. Atrial fibrillation. She had a single episode in March of 2013 which has not recurred. She is on chronic  anticoagulation with Eliquis.   3. Hypertension, well controlled.   4. Hyperlipidemia. She is on chronic Lipitor and fish oil. Will check labs today.  5. Hematoma left face and above left eye post trauma. Improving.

## 2016-09-04 ENCOUNTER — Ambulatory Visit (INDEPENDENT_AMBULATORY_CARE_PROVIDER_SITE_OTHER): Payer: Medicare Other | Admitting: Cardiology

## 2016-09-04 ENCOUNTER — Encounter: Payer: Self-pay | Admitting: Cardiology

## 2016-09-04 VITALS — BP 122/70 | HR 66 | Ht 61.0 in | Wt 136.0 lb

## 2016-09-04 DIAGNOSIS — I10 Essential (primary) hypertension: Secondary | ICD-10-CM

## 2016-09-04 DIAGNOSIS — E7800 Pure hypercholesterolemia, unspecified: Secondary | ICD-10-CM

## 2016-09-04 DIAGNOSIS — I209 Angina pectoris, unspecified: Secondary | ICD-10-CM | POA: Diagnosis not present

## 2016-09-04 DIAGNOSIS — I480 Paroxysmal atrial fibrillation: Secondary | ICD-10-CM | POA: Diagnosis not present

## 2016-09-04 DIAGNOSIS — I2510 Atherosclerotic heart disease of native coronary artery without angina pectoris: Secondary | ICD-10-CM | POA: Diagnosis not present

## 2016-09-04 NOTE — Patient Instructions (Signed)
Lab today bmet,lipid and hepatic panel,cbc     Your physician wants you to follow-up in: 6 months. You will receive a reminder letter in the mail two months in advance. If you don't receive a letter, please call our office to schedule the follow-up appointment.

## 2016-09-05 LAB — CBC WITH DIFFERENTIAL/PLATELET
Basophils Absolute: 0 10*3/uL (ref 0.0–0.2)
Basos: 0 %
EOS (ABSOLUTE): 0.1 10*3/uL (ref 0.0–0.4)
EOS: 2 %
HEMATOCRIT: 36.3 % (ref 34.0–46.6)
HEMOGLOBIN: 12.9 g/dL (ref 11.1–15.9)
IMMATURE GRANS (ABS): 0 10*3/uL (ref 0.0–0.1)
IMMATURE GRANULOCYTES: 0 %
Lymphocytes Absolute: 3.4 10*3/uL — ABNORMAL HIGH (ref 0.7–3.1)
Lymphs: 48 %
MCH: 35.1 pg — ABNORMAL HIGH (ref 26.6–33.0)
MCHC: 35.5 g/dL (ref 31.5–35.7)
MCV: 99 fL — ABNORMAL HIGH (ref 79–97)
MONOCYTES: 14 %
Monocytes Absolute: 1 10*3/uL — ABNORMAL HIGH (ref 0.1–0.9)
NEUTROS PCT: 36 %
Neutrophils Absolute: 2.5 10*3/uL (ref 1.4–7.0)
Platelets: 169 10*3/uL (ref 150–379)
RBC: 3.68 x10E6/uL — AB (ref 3.77–5.28)
RDW: 13.3 % (ref 12.3–15.4)
WBC: 6.9 10*3/uL (ref 3.4–10.8)

## 2016-09-05 LAB — BASIC METABOLIC PANEL
BUN/Creatinine Ratio: 15 (ref 12–28)
BUN: 13 mg/dL (ref 8–27)
CALCIUM: 9.3 mg/dL (ref 8.7–10.3)
CO2: 25 mmol/L (ref 20–29)
Chloride: 101 mmol/L (ref 96–106)
Creatinine, Ser: 0.84 mg/dL (ref 0.57–1.00)
GFR, EST AFRICAN AMERICAN: 76 mL/min/{1.73_m2} (ref >59)
GFR, EST NON AFRICAN AMERICAN: 66 mL/min/{1.73_m2} (ref >59)
Glucose: 100 mg/dL — ABNORMAL HIGH (ref 65–99)
POTASSIUM: 5 mmol/L (ref 3.5–5.2)
Sodium: 142 mmol/L (ref 134–144)

## 2016-09-05 LAB — HEPATIC FUNCTION PANEL
ALT: 20 IU/L (ref 0–32)
AST: 27 IU/L (ref 0–40)
Albumin: 4.3 g/dL (ref 3.5–4.8)
Alkaline Phosphatase: 74 IU/L (ref 39–117)
BILIRUBIN, DIRECT: 0.17 mg/dL (ref 0.00–0.40)
Bilirubin Total: 0.6 mg/dL (ref 0.0–1.2)
TOTAL PROTEIN: 8 g/dL (ref 6.0–8.5)

## 2016-09-05 LAB — LIPID PANEL
CHOL/HDL RATIO: 4.6 ratio — AB (ref 0.0–4.4)
Cholesterol, Total: 162 mg/dL (ref 100–199)
HDL: 35 mg/dL — AB (ref >39)
LDL CALC: 69 mg/dL (ref 0–99)
Triglycerides: 288 mg/dL — ABNORMAL HIGH (ref 0–149)
VLDL Cholesterol Cal: 58 mg/dL — ABNORMAL HIGH (ref 5–40)

## 2016-11-12 ENCOUNTER — Other Ambulatory Visit: Payer: Self-pay | Admitting: Cardiology

## 2016-11-12 NOTE — Telephone Encounter (Signed)
Patient calling the office for samples of medication: ° ° °1.  What medication and dosage are you requesting samples for? °Eliquis 5 mg  °2.  Are you currently out of this medication?  °Yes ° ° ° °

## 2016-11-12 NOTE — Telephone Encounter (Signed)
Samples at the front desk Pt notified 

## 2016-12-05 ENCOUNTER — Other Ambulatory Visit: Payer: Self-pay | Admitting: Cardiology

## 2016-12-05 DIAGNOSIS — I2510 Atherosclerotic heart disease of native coronary artery without angina pectoris: Secondary | ICD-10-CM

## 2016-12-18 DIAGNOSIS — Z23 Encounter for immunization: Secondary | ICD-10-CM | POA: Diagnosis not present

## 2016-12-23 ENCOUNTER — Telehealth: Payer: Self-pay | Admitting: Cardiology

## 2016-12-23 NOTE — Telephone Encounter (Signed)
Patient calling the office for samples of medication:   1.  What medication and dosage are you requesting samples for? Eliquis 5mg    2.  Are you currently out of this medication? Yes   [name] is currently in the donut hole

## 2016-12-23 NOTE — Telephone Encounter (Signed)
Returned call to patient office currently out of Eliquis samples.

## 2016-12-25 DIAGNOSIS — L570 Actinic keratosis: Secondary | ICD-10-CM | POA: Diagnosis not present

## 2016-12-25 DIAGNOSIS — D692 Other nonthrombocytopenic purpura: Secondary | ICD-10-CM | POA: Diagnosis not present

## 2016-12-25 DIAGNOSIS — L814 Other melanin hyperpigmentation: Secondary | ICD-10-CM | POA: Diagnosis not present

## 2016-12-25 DIAGNOSIS — Z85828 Personal history of other malignant neoplasm of skin: Secondary | ICD-10-CM | POA: Diagnosis not present

## 2016-12-25 DIAGNOSIS — L821 Other seborrheic keratosis: Secondary | ICD-10-CM | POA: Diagnosis not present

## 2016-12-26 DIAGNOSIS — N3020 Other chronic cystitis without hematuria: Secondary | ICD-10-CM | POA: Diagnosis not present

## 2016-12-30 DIAGNOSIS — E7849 Other hyperlipidemia: Secondary | ICD-10-CM | POA: Diagnosis not present

## 2016-12-30 DIAGNOSIS — I10 Essential (primary) hypertension: Secondary | ICD-10-CM | POA: Diagnosis not present

## 2016-12-30 DIAGNOSIS — R82998 Other abnormal findings in urine: Secondary | ICD-10-CM | POA: Diagnosis not present

## 2017-01-12 DIAGNOSIS — Z0000 Encounter for general adult medical examination without abnormal findings: Secondary | ICD-10-CM | POA: Diagnosis not present

## 2017-01-12 DIAGNOSIS — I2510 Atherosclerotic heart disease of native coronary artery without angina pectoris: Secondary | ICD-10-CM | POA: Diagnosis not present

## 2017-01-12 DIAGNOSIS — I10 Essential (primary) hypertension: Secondary | ICD-10-CM | POA: Diagnosis not present

## 2017-01-12 DIAGNOSIS — N390 Urinary tract infection, site not specified: Secondary | ICD-10-CM | POA: Diagnosis not present

## 2017-01-12 DIAGNOSIS — Z6826 Body mass index (BMI) 26.0-26.9, adult: Secondary | ICD-10-CM | POA: Diagnosis not present

## 2017-01-12 DIAGNOSIS — E7849 Other hyperlipidemia: Secondary | ICD-10-CM | POA: Diagnosis not present

## 2017-01-12 DIAGNOSIS — I480 Paroxysmal atrial fibrillation: Secondary | ICD-10-CM | POA: Diagnosis not present

## 2017-01-12 DIAGNOSIS — R2681 Unsteadiness on feet: Secondary | ICD-10-CM | POA: Diagnosis not present

## 2017-02-04 ENCOUNTER — Telehealth: Payer: Self-pay | Admitting: Cardiology

## 2017-02-04 NOTE — Telephone Encounter (Signed)
Patient calling, states that she is returning your call.

## 2017-02-04 NOTE — Telephone Encounter (Signed)
Returned call to patient Eliquis 5 mg samples left at [facility] office front desk. 

## 2017-02-11 ENCOUNTER — Other Ambulatory Visit: Payer: Self-pay | Admitting: Cardiology

## 2017-02-11 DIAGNOSIS — I2510 Atherosclerotic heart disease of native coronary artery without angina pectoris: Secondary | ICD-10-CM

## 2017-02-11 NOTE — Telephone Encounter (Signed)
Rx(s) sent to pharmacy electronically.  

## 2017-03-02 ENCOUNTER — Other Ambulatory Visit: Payer: Self-pay | Admitting: Cardiology

## 2017-03-06 ENCOUNTER — Telehealth: Payer: Self-pay | Admitting: Cardiology

## 2017-03-06 NOTE — Telephone Encounter (Signed)
New Message  Pt call requesting to speak with RN about her current medication list.

## 2017-03-06 NOTE — Telephone Encounter (Signed)
Returned call to patient Eliquis 5 mg samples left at [facility] office front desk. 

## 2017-04-11 ENCOUNTER — Other Ambulatory Visit: Payer: Self-pay | Admitting: Cardiology

## 2017-04-15 NOTE — Progress Notes (Signed)
[name] Date of Birth: 10/21/1934   History of Present Illness: [name] is seen for yearly follow up of CAD. She has a history of coronary disease status post CABG in 1986. Cardiac catheterization 2009 showed the LIMA graft to LAD was patent but her other grafts were occluded. She had no suitable vessels for PCI or bypass. She did have an episode of atrial fibrillation March 2013 and has been on chronic anticoagulation. She had an excellent response to Ranexa.   On follow up today she is doing well. She has some angina every once in awhile but describes this as mild and doesn't take Ntg. She stays active at home. No bleeding on Eliquis. No dyspnea. She does note she feels more tired.   Current Outpatient Medications on File Prior to Visit  Medication Sig Dispense Refill  . amLODipine (NORVASC) 5 MG tablet Take 1 tablet (5 mg total) by mouth daily. 90 tablet 0  . apixaban (ELIQUIS) 5 MG TABS tablet Take 1 tablet (5 mg total) by mouth 2 (two) times daily. 14 tablet 0  . atorvastatin (LIPITOR) 40 MG tablet TAKE 1 TABLET BY MOUTH AT BEDTIME 90 tablet 2  . isosorbide mononitrate (IMDUR) 60 MG 24 hr tablet Take 1 tablet (60 mg total) by mouth daily. 90 tablet 1  . Lansoprazole (PREVACID PO) Take by mouth daily.      . metoprolol tartrate (LOPRESSOR) 50 MG tablet Take 0.5 tablets (25 mg total) by mouth 2 (two) times daily. 90 tablet 2  . NITROSTAT 0.4 MG SL tablet Reported on 08/09/2015    . nystatin cream (MYCOSTATIN) APPLY TO AFFECTED AREA TWICE DAILY FOR 7 DAYS 30 g 1  . Omega-3 Fatty Acids (FISH OIL) 1000 MG CAPS Take by mouth daily.      . ranolazine (RANEXA) 500 MG 12 hr tablet Take 1 tablet (500 mg total) by mouth 2 (two) times daily. 180 tablet 3  . trimethoprim (TRIMPEX) 100 MG tablet Take 1 tablet (100 mg total) by mouth daily. 90 tablet 4  . losartan (COZAAR) 100 MG tablet Take 1 tablet (100 mg total) by mouth daily. 90 tablet 3   No current facility-administered medications on  file prior to visit.     Allergies  Allergen Reactions  . Demerol Nausea And Vomiting  . Niacin   . Niaspan [Niacin Er] Other (See Comments)    Facial flushing  . Sulfa Antibiotics Other (See Comments)    Causes weakness  . Tape Itching and Other (See Comments)    Burns, too    Past Medical History:  Diagnosis Date  . Anxiety   . Atrial fibrillation ([place])   . Coronary artery disease    STATUS POST CABG  . DA (degenerative arthritis)   . Hearing loss   . Heartburn   . Hyperlipidemia   . Hypertension   . Ovarian cyst    h/o complex right ovarian cyst/followed yearly  . Skin cancer     Past Surgical History:  Procedure Laterality Date  . CARDIAC CATHETERIZATION  04/16/2007   NORMAL LEFT VENTRICULAR SIZE AND CONTRACTILITY WITH NORMAL SYSTOLIC FUNCTION. EF 60%. THERE IS MILD TO MODERATE MITRAL INSUFFICIENCY  . CHOLECYSTECTOMY    . CORONARY ARTERY BYPASS GRAFT  1986   X3, LIMA GRAFT TO THE LAD. SHE HAS POOR TARGETS  . HYSTEROSCOPY  8/04   polyp resection  . NEPHRECTOMY     LEFT  . SKIN CANCER EXCISION    . TONSILLECTOMY    .  TUBAL LIGATION      Social History   Tobacco Use  Smoking Status Never Smoker  Smokeless Tobacco Never Used    Social History   Substance and Sexual Activity  Alcohol Use No    Family History  Problem Relation Age of Onset  . Hypertension Father   . Breast cancer Sister     Review of Systems: As noted in history of present illness.  All other systems were reviewed and are negative.  Physical Exam: BP 132/72   Pulse 60   Ht 5\' 1"  (1.549 m)   Wt 135 lb 9.6 oz (61.5 kg)   LMP 03/03/1992   BMI 25.62 kg/m  GENERAL:  Well appearing WF in NAD HEENT:  PERRL, EOMI, sclera are clear. Oropharynx is clear. NECK:  No jugular venous distention, carotid upstroke brisk and symmetric, no bruits, no thyromegaly or adenopathy LUNGS:  Clear to auscultation bilaterally CHEST:  Unremarkable HEART:  RRR,  PMI not displaced or sustained,S1 and  S2 within normal limits, no S3, no S4: no clicks, no rubs, no murmurs ABD:  Soft, nontender. BS +, no masses or bruits. No hepatomegaly, no splenomegaly EXT:  2 + pulses throughout, no edema, no cyanosis no clubbing SKIN:  Warm and dry.  No rashes NEURO:  Alert and oriented x 3. Cranial nerves II through XII intact. PSYCH:  Cognitively intact    LABORATORY DATA: Lab Results  Component Value Date   WBC 6.9 09/04/2016   HGB 12.9 09/04/2016   HCT 36.3 09/04/2016   PLT 169 09/04/2016   GLUCOSE 100 (H) 09/04/2016   CHOL 162 09/04/2016   TRIG 288 (H) 09/04/2016   HDL 35 (L) 09/04/2016   LDLCALC 69 09/04/2016   ALT 20 09/04/2016   AST 27 09/04/2016   NA 142 09/04/2016   K 5.0 09/04/2016   CL 101 09/04/2016   CREATININE 0.84 09/04/2016   BUN 13 09/04/2016   CO2 25 09/04/2016   TSH 2.04 07/19/2015   INR 2.2 05/21/2012   HGBA1C  04/16/2007    5.6 (NOTE)   The ADA recommends the following therapeutic goals for glycemic   control related to Hgb A1C measurement:   Goal of Therapy:   < 7.0% Hgb A1C   Action Suggested:  > 8.0% Hgb A1C   Ref:  Diabetes Care, 22, Suppl. 1, 1999    Dated 12/30/16: cholesterol 159, triglycerides 125, HDL 35, LDL 99. CBC and chemistries normal Dated 04/15/17: A1c 5.6%.  Assessment / Plan: 1. Coronary disease with stable class 1-2 angina. She is status post CABG in 1986. Prior cardiac catheterization in 2009 showed a patent LIMA to the LAD but her other grafts were occluded. She did not have any suitable vessels for PCI or redo bypass. She is on aggressive antianginal therapy including amlodipine, nitrates, metoprolol, and Ranexa. We will continue.  2. Atrial fibrillation. She had a single episode in March of 2013 which has not recurred. She is on chronic anticoagulation with Eliquis.   3. Hypertension, well controlled.   4. Hyperlipidemia. She is on chronic Lipitor and fish oil.   I will follow up in 6 months

## 2017-04-17 ENCOUNTER — Ambulatory Visit (INDEPENDENT_AMBULATORY_CARE_PROVIDER_SITE_OTHER): Payer: Medicare Other | Admitting: Cardiology

## 2017-04-17 ENCOUNTER — Encounter: Payer: Self-pay | Admitting: Cardiology

## 2017-04-17 VITALS — BP 132/72 | HR 60 | Ht 61.0 in | Wt 135.6 lb

## 2017-04-17 DIAGNOSIS — I10 Essential (primary) hypertension: Secondary | ICD-10-CM

## 2017-04-17 DIAGNOSIS — I209 Angina pectoris, unspecified: Secondary | ICD-10-CM

## 2017-04-17 DIAGNOSIS — E7800 Pure hypercholesterolemia, unspecified: Secondary | ICD-10-CM

## 2017-04-17 DIAGNOSIS — I2510 Atherosclerotic heart disease of native coronary artery without angina pectoris: Secondary | ICD-10-CM

## 2017-04-17 NOTE — Patient Instructions (Signed)
Continue your current therapy  I will see you in 6 months.   

## 2017-04-27 ENCOUNTER — Telehealth: Payer: Self-pay | Admitting: Cardiology

## 2017-04-27 MED ORDER — RANOLAZINE ER 500 MG PO TB12: 500.0000 mg | ORAL_TABLET | Freq: Two times a day (BID) | ORAL | 12 refills | Status: DC

## 2017-04-27 NOTE — Telephone Encounter (Signed)
Spoke with pt, refill for ranexa sent to the pharmacy.

## 2017-04-27 NOTE — Telephone Encounter (Signed)
New message  Pt verbalized that she is returning call for RN  To go over the pts medication list

## 2017-06-01 ENCOUNTER — Other Ambulatory Visit: Payer: Self-pay | Admitting: Cardiology

## 2017-06-01 NOTE — Telephone Encounter (Signed)
REFILL 

## 2017-08-18 ENCOUNTER — Telehealth: Payer: Self-pay | Admitting: Cardiology

## 2017-08-18 NOTE — Telephone Encounter (Signed)
Pt aware eliquis 5 mg samples left at front desk for pick up ./cy

## 2017-08-18 NOTE — Telephone Encounter (Signed)
New message:      Pt is wanting some samples of apixaban (ELIQUIS) 5 MG TABS tablet.

## 2017-09-07 ENCOUNTER — Ambulatory Visit (INDEPENDENT_AMBULATORY_CARE_PROVIDER_SITE_OTHER): Payer: Medicare Other | Admitting: Obstetrics & Gynecology

## 2017-09-07 ENCOUNTER — Telehealth: Payer: Self-pay | Admitting: Obstetrics & Gynecology

## 2017-09-07 ENCOUNTER — Encounter: Payer: Self-pay | Admitting: Obstetrics & Gynecology

## 2017-09-07 ENCOUNTER — Telehealth: Payer: Self-pay | Admitting: Cardiology

## 2017-09-07 VITALS — BP 110/60 | HR 64 | Ht 61.0 in | Wt 140.4 lb

## 2017-09-07 DIAGNOSIS — R300 Dysuria: Secondary | ICD-10-CM | POA: Diagnosis not present

## 2017-09-07 DIAGNOSIS — I209 Angina pectoris, unspecified: Secondary | ICD-10-CM | POA: Diagnosis not present

## 2017-09-07 DIAGNOSIS — N390 Urinary tract infection, site not specified: Secondary | ICD-10-CM | POA: Diagnosis not present

## 2017-09-07 LAB — POCT URINALYSIS DIPSTICK
BILIRUBIN UA: NEGATIVE
GLUCOSE UA: NEGATIVE
KETONES UA: NEGATIVE
Nitrite, UA: NEGATIVE
Protein, UA: POSITIVE — AB
UROBILINOGEN UA: 0.2 U/dL
pH, UA: 7 (ref 5.0–8.0)

## 2017-09-07 MED ORDER — NITROFURANTOIN MONOHYD MACRO 100 MG PO CAPS: 100.0000 mg | ORAL_CAPSULE | Freq: Two times a day (BID) | ORAL | 0 refills | Status: DC

## 2017-09-07 MED ORDER — NYSTATIN 100000 UNIT/GM EX CREA: TOPICAL_CREAM | CUTANEOUS | 1 refills | Status: DC

## 2017-09-07 NOTE — Progress Notes (Signed)
GYNECOLOGY  VISIT  CC:   Painful urination  HPI: 83 y.o. G1P1 Married Caucasian female here for pain with urination that started about a week ago.  She reports she kept thinking it would get better but it didn't.  Denies vaginal bleeding.  Denies visible blood but it has looked dark in color.  There is some odor as well.  Denies back pain or fever.  Reports the most uncomfortable feeling is when she goes to sit down.  As the days goes on, she has this pain more significantly.    GYNECOLOGIC HISTORY: Patient's last menstrual period was 03/03/1992. Contraception: post menopausal  Menopausal hormone therapy: none  Patient Active Problem List   Diagnosis Date Noted  . Bruit of left carotid artery 04/11/2013  . Vertigo 04/11/2013  . Recurrent UTI 09/01/2012  . Long term (current) use of anticoagulants 06/03/2011  . Angina pectoris ([place]) 05/28/2011  . Atrial fibrillation ([place]) 05/28/2011  . HTN (hypertension) 05/28/2011  . CAD (coronary artery disease) 05/28/2011  . Coronary artery disease   . Hyperlipidemia   . Anxiety   . DA (degenerative arthritis)   . Skin cancer   . Hearing loss   . Heartburn     Past Medical History:  Diagnosis Date  . Anxiety   . Atrial fibrillation ([place])   . Coronary artery disease    STATUS POST CABG  . DA (degenerative arthritis)   . Hearing loss   . Heartburn   . Hyperlipidemia   . Hypertension   . Ovarian cyst    h/o complex right ovarian cyst/followed yearly  . Skin cancer     Past Surgical History:  Procedure Laterality Date  . CARDIAC CATHETERIZATION  04/16/2007   NORMAL LEFT VENTRICULAR SIZE AND CONTRACTILITY WITH NORMAL SYSTOLIC FUNCTION. EF 60%. THERE IS MILD TO MODERATE MITRAL INSUFFICIENCY  . CHOLECYSTECTOMY    . CORONARY ARTERY BYPASS GRAFT  1986   X3, LIMA GRAFT TO THE LAD. SHE HAS POOR TARGETS  . HYSTEROSCOPY  8/04   polyp resection  . NEPHRECTOMY     LEFT  . SKIN CANCER EXCISION    . TONSILLECTOMY    . TUBAL LIGATION       MEDS:   Current Outpatient Medications on File Prior to Visit  Medication Sig Dispense Refill  . amLODipine (NORVASC) 5 MG tablet Take 1 tablet (5 mg total) by mouth daily. 90 tablet 1  . apixaban (ELIQUIS) 5 MG TABS tablet Take 1 tablet (5 mg total) by mouth 2 (two) times daily. 14 tablet 0  . atorvastatin (LIPITOR) 40 MG tablet TAKE 1 TABLET BY MOUTH AT BEDTIME 90 tablet 1  . isosorbide mononitrate (IMDUR) 60 MG 24 hr tablet Take 1 tablet (60 mg total) by mouth daily. 90 tablet 1  . metoprolol tartrate (LOPRESSOR) 50 MG tablet Take 0.5 tablets (25 mg total) by mouth 2 (two) times daily. 90 tablet 2  . NITROSTAT 0.4 MG SL tablet Reported on 08/09/2015    . nystatin cream (MYCOSTATIN) APPLY TO AFFECTED AREA TWICE DAILY FOR 7 DAYS 30 g 1  . Omega-3 Fatty Acids (FISH OIL) 1000 MG CAPS Take by mouth daily.      [name] omeprazole (PRILOSEC) 20 MG capsule Take 20 mg by mouth daily.    . ranolazine (RANEXA) 500 MG 12 hr tablet Take 1 tablet (500 mg total) by mouth 2 (two) times daily. 60 tablet 12  . trimethoprim (TRIMPEX) 100 MG tablet Take 1 tablet (100 mg total) by mouth  daily. 90 tablet 4  . losartan (COZAAR) 100 MG tablet Take 1 tablet (100 mg total) by mouth daily. 90 tablet 3   No current facility-administered medications on file prior to visit.     ALLERGIES: Demerol; Niacin; Niaspan [niacin er]; Sulfa antibiotics; and Tape  Family History  Problem Relation Age of Onset  . Hypertension Father   . Breast cancer Sister     SH:  Married, non smoker  Review of Systems  Genitourinary: Positive for dysuria and frequency.       Loss of urine with sneeze or cough   All other systems reviewed and are negative.   PHYSICAL EXAMINATION:    BP 110/60 (BP Location: Right Arm, Patient Position: Sitting, Cuff Size: Normal)   Pulse 64   Ht 5\' 1"  (1.549 m)   Wt 140 lb 6.4 oz (63.7 kg)   LMP 03/03/1992   BMI 26.53 kg/m     General appearance: alert, cooperative and appears stated  age Flank:  No CVA tenderness Abdomen: soft, non-tender; bowel sounds normal; no masses,  no organomegaly Lymph:  No inguinal LAD Noted  Pelvic: External genitalia:  no lesions              Urethra:  normal appearing urethra with no masses, tenderness or lesions              Bartholins and Skenes: normal                 Vagina: normal appearing vagina with normal color and discharge, no lesions              Cervix: no lesions              Bimanual Exam:  Uterus:  normal size, contour, position, consistency, mobility, non-tender              Adnexa: no mass, fullness, tenderness  Chaperone was present for exam.  Assessment: Dysuria H/O UTIs, recurrent H/O skin candida  Plan: Urine culture and micro pending Macrobid 100mg  bid x 5 days Rx for nystatin cream to be used BID for up to 7 days with skin issues

## 2017-09-07 NOTE — Telephone Encounter (Signed)
Call to patient, line busy.  

## 2017-09-07 NOTE — Telephone Encounter (Signed)
Spoke with patient. Patient reports pelvic pain when moving up and down. Describes as burning, internal pain. Can feel a bulge, pushes this back in, is  Not new. Urinary frequency, urgency and voiding small amounts. Dark brown urine. Chills with pain, has not checked temp. Symptoms started 1 wk ago.   Denies blood in urine, lower back pain, N/V.   Last OV 08/27/15  Recommended OV for further evaluation, advised will review schedule with Dr. Sabra Heck and return call. Patient agreeable.

## 2017-09-07 NOTE — Telephone Encounter (Signed)
Ranexa 500 mg #3 lot # [id number] exp 4/21 Eliquis 5 mg @2  lot #[id number] exp 6/21  Left message ready for pick up

## 2017-09-07 NOTE — Telephone Encounter (Signed)
Patient has vaginal pain whenever she sit down.

## 2017-09-07 NOTE — Telephone Encounter (Signed)
Reviewed with Dr. Sabra Heck, call returned to patient, OV scheduled for today at 3:15pm with Dr. Sabra Heck. Patient verbalizes understanding and is agreeable. Encounter closed.

## 2017-09-07 NOTE — Telephone Encounter (Signed)
New message   Patient calling with questions about Ranexa and Eliquis samples   1.  What medication and dosage are you requesting samples for?apixaban (ELIQUIS) 5 MG TABS tablet  2.  Are you currently out of this medication? yes

## 2017-09-08 ENCOUNTER — Telehealth: Payer: Self-pay | Admitting: Obstetrics & Gynecology

## 2017-09-08 LAB — URINALYSIS, MICROSCOPIC ONLY
Bacteria, UA: NONE SEEN
Casts: NONE SEEN /lpf
Epithelial Cells (non renal): NONE SEEN /hpf (ref 0–10)

## 2017-09-08 NOTE — Telephone Encounter (Signed)
Call to patient. Patient states that she started the macrobid last night and then took one again this am at breakfast. States she's not sure if she took the pill last night with food, but did take it with a bowl of cereal this morning. Patient states around lunch she started feeling really nauseated. Denies vomiting or diarrhea, but states she feels like she could vomit. RN advised patient to not take anymore of the macrobid until RN reviewed with Dr. Sabra Heck. Patient agreeable. Advised will return call with recommendations from Dr. Sabra Heck.   Routing to provider for review.

## 2017-09-08 NOTE — Telephone Encounter (Signed)
Patient is having an adverse reaction to the medication prescribed to her yesterday for UTI.

## 2017-09-09 ENCOUNTER — Telehealth: Payer: Self-pay | Admitting: Cardiology

## 2017-09-09 LAB — URINE CULTURE

## 2017-09-09 MED ORDER — CEPHALEXIN 500 MG PO CAPS
500.0000 mg | ORAL_CAPSULE | Freq: Two times a day (BID) | ORAL | 0 refills | Status: AC
Start: 2017-09-09 — End: 2017-09-14

## 2017-09-09 NOTE — Telephone Encounter (Signed)
Patient walked in to office requesting paperwork for Ranexa patient assistance. She wanted a copy of her previous years application. Provided patient with 2017 paperwork that was scanned to epic, could not locate copy of 4417 application. Provided her with a blank copy of application and advised that she call Ranex[facility] first, as they may not be accepting new applications. If they are, instructed patient on which sections to complete and advised she bring/mail to office the application for MD to sign and submit

## 2017-09-09 NOTE — Telephone Encounter (Signed)
Ok to switch to keflex 500mg  bid x 5 days.  Please make sure she takes this with food.  Thanks.

## 2017-09-09 NOTE — Telephone Encounter (Signed)
Spoke with patient, advised as seen below per Dr. Sabra Heck. Rx for Keflex to verified pharmacy on file. Patient verbalizes understanding and is agreeable. Encounter closed.

## 2017-09-11 ENCOUNTER — Telehealth: Payer: Self-pay | Admitting: *Deleted

## 2017-09-11 DIAGNOSIS — R82998 Other abnormal findings in urine: Secondary | ICD-10-CM

## 2017-09-11 NOTE — Telephone Encounter (Signed)
Pt notified. Verbalized understanding. Nurse appt scheduled  Orders placed.

## 2017-09-11 NOTE — Telephone Encounter (Signed)
Left voice mail to call back 

## 2017-09-11 NOTE — Telephone Encounter (Signed)
-----   Message from Megan Salon, MD sent at 09/10/2017  6:01 PM EDT ----- Please let pt know her urine micro showed clumps of white blood cells and red cells.  The culture was negative but due to the amount of WBCs present in the urine, I want her to finish the antibiotics and repeat the urine micro and culture next week.  Ok to put on lab schedule.  Please see if she is feeling better.  Thanks.

## 2017-09-11 NOTE — Telephone Encounter (Signed)
Patient returning call to [place]. °

## 2017-09-17 ENCOUNTER — Ambulatory Visit (INDEPENDENT_AMBULATORY_CARE_PROVIDER_SITE_OTHER): Payer: Medicare Other

## 2017-09-17 VITALS — BP 110/62 | HR 64 | Resp 14 | Ht 61.0 in | Wt 134.0 lb

## 2017-09-17 DIAGNOSIS — R82998 Other abnormal findings in urine: Secondary | ICD-10-CM | POA: Diagnosis not present

## 2017-09-17 DIAGNOSIS — N3090 Cystitis, unspecified without hematuria: Secondary | ICD-10-CM

## 2017-09-17 DIAGNOSIS — I209 Angina pectoris, unspecified: Secondary | ICD-10-CM | POA: Diagnosis not present

## 2017-09-17 NOTE — Progress Notes (Signed)
Patient here for a repeat Urin micro and culture. Collected and sent to the lab.

## 2017-09-18 LAB — URINALYSIS, MICROSCOPIC ONLY
CASTS: NONE SEEN /LPF
Epithelial Cells (non renal): NONE SEEN /hpf (ref 0–10)

## 2017-09-19 MED ORDER — CIPROFLOXACIN HCL 250 MG PO TABS: 250.0000 mg | ORAL_TABLET | Freq: Two times a day (BID) | ORAL | 0 refills | Status: DC

## 2017-09-21 LAB — URINE CULTURE

## 2017-09-24 NOTE — Addendum Note (Signed)
Addended by: Megan Salon on: 09/24/2017 06:15 AM   Modules accepted: Orders

## 2017-09-29 ENCOUNTER — Ambulatory Visit (INDEPENDENT_AMBULATORY_CARE_PROVIDER_SITE_OTHER): Payer: Medicare Other

## 2017-09-29 DIAGNOSIS — N3090 Cystitis, unspecified without hematuria: Secondary | ICD-10-CM

## 2017-09-29 NOTE — Progress Notes (Signed)
Patient is here for a recheck urin micro. She states that she has had some discomfort when urinating. She states that the Cipro made her nauseated.

## 2017-10-02 ENCOUNTER — Telehealth: Payer: Self-pay | Admitting: Obstetrics & Gynecology

## 2017-10-02 LAB — URINE CULTURE

## 2017-10-02 MED ORDER — SULFAMETHOXAZOLE-TRIMETHOPRIM 800-160 MG PO TABS: 1.0000 | ORAL_TABLET | Freq: Two times a day (BID) | ORAL | 0 refills | Status: AC

## 2017-10-02 NOTE — Telephone Encounter (Addendum)
Return call to patient and order from Dr. Sabra Heck for Bactrim DS 1 tablet po BID x 5 days.  Pt has intolerance to sulfa drugs listed on her allergies. Need to check if able to take Bactrim, other option, doxycycline will cause GI upset. Referral to Urology if not currently seeing urology.   Called home and left message to call back, called cell phone did not leave message.

## 2017-10-02 NOTE — Telephone Encounter (Signed)
Reviewed with Dr. Sabra Heck, call returned to patient. Reviewed risk of sepsis from UTI with patient. Urology consult recommended. Patient agreeable. Advised will call to schedule and return call.    Call placed to [facility], scheduled for 10/07/17 at 9am with Dr. Matilde Sprang.   Call returned to patient, advised of appt details as seen above per Dr. Sabra Heck. Patient verbalizes understanding and is agreeable.   OV notes and recent labs faxed to [facility] at [phone].   Routing to provider for final review. Patient is agreeable to disposition. Will close encounter.

## 2017-10-02 NOTE — Telephone Encounter (Signed)
Patient called and said she is continuing to have "symptoms of a urinary tract infection." She requested to speak with the nurse about this and declined an appointment at this time.

## 2017-10-02 NOTE — Telephone Encounter (Signed)
Spoke with patient. She states she continues to have painful urination. Denies fevers, chills, back pain, and nausea/vomiting.  States she completed her antibiotics as ordered.  Last urine culture is pending from 09/29/17, no preliminary results available at this time.  Advised patient will review with Dr. Sabra Heck and return call with plan of care. Pt agreeable.

## 2017-10-02 NOTE — Telephone Encounter (Signed)
Spoke with patient, advised of recommendations per Dr. Sabra Heck. Patient states she just "feels wiped out after taking sulfa abx, is unsure if she has taken Bactrim ". Patient requesting Rx for Bactrim, sent to verified pharmacy.    Patient states she has seen Dr. Matilde Sprang in the past, she thinks a few months ago. Patient admant she does not want to return to urology right now, requesting to wait until UTI has resolved. Patient declined assistance with scheduling.   Advised will update Dr. Sabra Heck and return call with any additional recommendations. Patient verbalizes understanding and is agreeable.

## 2017-10-02 NOTE — Telephone Encounter (Signed)
Late entry for 1015: Reviewed with Dr. Sabra Heck.  Urine culture results pending from 09/29/17.  Call to [facility] to request results be sent.

## 2017-10-02 NOTE — Telephone Encounter (Signed)
Patient called to let the nurse know it's okay to leave a detailed message on her voicemail and she'll call back if she's not reached. Her husband has an appointment this afternoon

## 2017-10-07 DIAGNOSIS — R350 Frequency of micturition: Secondary | ICD-10-CM | POA: Diagnosis not present

## 2017-10-07 DIAGNOSIS — N3020 Other chronic cystitis without hematuria: Secondary | ICD-10-CM | POA: Diagnosis not present

## 2017-10-15 ENCOUNTER — Telehealth: Payer: Self-pay | Admitting: Cardiology

## 2017-10-15 NOTE — Telephone Encounter (Signed)
New Message ° ° °Patient calling the office for samples of medication: ° ° °1.  What medication and dosage are you requesting samples for? Eliquis ° °2.  Are you currently out of this medication? One day remaining  ° ° ° °

## 2017-10-15 NOTE — Telephone Encounter (Signed)
Medication given: Eliquis 5mg  Lot #: [id number] Expiration: 12/2019 Qty: 2 boxes

## 2017-11-07 ENCOUNTER — Other Ambulatory Visit: Payer: Self-pay | Admitting: Cardiology

## 2017-11-09 ENCOUNTER — Encounter: Payer: Self-pay | Admitting: Cardiology

## 2017-11-09 DIAGNOSIS — B029 Zoster without complications: Secondary | ICD-10-CM | POA: Diagnosis not present

## 2017-11-14 ENCOUNTER — Other Ambulatory Visit: Payer: Self-pay | Admitting: Cardiology

## 2017-11-21 NOTE — Progress Notes (Signed)
[name] Date of Birth: 06-01-1934   History of Present Illness: [name] is seen for yearly follow up of CAD. She has a history of coronary disease status post CABG in 1986. Cardiac catheterization 2009 showed the LIMA graft to LAD was patent but her other grafts were occluded. She had no suitable vessels for PCI or bypass. She did have an episode of atrial fibrillation March 2013 and has been on chronic anticoagulation. She had an excellent response to Ranexa.   On follow up today she is doing well from a cardiac standpoint. She has very infrequent  Angina. Hasn't had to take Ntg. She stays active at home. No bleeding on Eliquis. No dyspnea. She did develop shingles on her chest one week ago.   Current Outpatient Medications on File Prior to Visit  Medication Sig Dispense Refill  . amLODipine (NORVASC) 5 MG tablet Take 1 tablet (5 mg total) by mouth daily. 90 tablet 1  . apixaban (ELIQUIS) 5 MG TABS tablet Take 1 tablet (5 mg total) by mouth 2 (two) times daily. 14 tablet 0  . atorvastatin (LIPITOR) 40 MG tablet TAKE 1 TABLET BY MOUTH AT BEDTIME 90 tablet 1  . isosorbide mononitrate (IMDUR) 60 MG 24 hr tablet TAKE 1 TABLET BY MOUTH DAILY 90 tablet 0  . losartan (COZAAR) 100 MG tablet Take 1 tablet (100 mg total) by mouth daily. 90 tablet 3  . metoprolol tartrate (LOPRESSOR) 50 MG tablet Take 0.5 tablets (25 mg total) by mouth 2 (two) times daily. 90 tablet 2  . NITROSTAT 0.4 MG SL tablet Reported on 08/09/2015    . nystatin cream (MYCOSTATIN) APPLY TO AFFECTED AREA TWICE DAILY FOR 7 DAYS 30 g 1  . Omega-3 Fatty Acids (FISH OIL) 1000 MG CAPS Take by mouth daily.      [name] omeprazole (PRILOSEC) 20 MG capsule Take 20 mg by mouth daily.    . ranolazine (RANEXA) 500 MG 12 hr tablet Take 1 tablet (500 mg total) by mouth 2 (two) times daily. 60 tablet 12  . trimethoprim (TRIMPEX) 100 MG tablet Take 1 tablet (100 mg total) by mouth daily. 90 tablet 4   No current facility-administered  medications on file prior to visit.     Allergies  Allergen Reactions  . Demerol Nausea And Vomiting  . Niacin   . Niaspan [Niacin Er] Other (See Comments)    Facial flushing  . Sulfa Antibiotics Other (See Comments)    Causes weakness  . Tape Itching and Other (See Comments)    Burns, too    Past Medical History:  Diagnosis Date  . Anxiety   . Atrial fibrillation ([place])   . Coronary artery disease    STATUS POST CABG  . DA (degenerative arthritis)   . Hearing loss   . Heartburn   . Hyperlipidemia   . Hypertension   . Ovarian cyst    h/o complex right ovarian cyst/followed yearly  . Skin cancer     Past Surgical History:  Procedure Laterality Date  . CARDIAC CATHETERIZATION  04/16/2007   NORMAL LEFT VENTRICULAR SIZE AND CONTRACTILITY WITH NORMAL SYSTOLIC FUNCTION. EF 60%. THERE IS MILD TO MODERATE MITRAL INSUFFICIENCY  . CHOLECYSTECTOMY    . CORONARY ARTERY BYPASS GRAFT  1986   X3, LIMA GRAFT TO THE LAD. SHE HAS POOR TARGETS  . HYSTEROSCOPY  8/04   polyp resection  . NEPHRECTOMY     LEFT  . SKIN CANCER EXCISION    . TONSILLECTOMY    .  TUBAL LIGATION      Social History   Tobacco Use  Smoking Status Never Smoker  Smokeless Tobacco Never Used    Social History   Substance and Sexual Activity  Alcohol Use No    Family History  Problem Relation Age of Onset  . Hypertension Father   . Breast cancer Sister     Review of Systems: As noted in history of present illness.  All other systems were reviewed and are negative.  Physical Exam: LMP 03/03/1992  GENERAL:  Well appearing WF in NAD HEENT:  PERRL, EOMI, sclera are clear. Oropharynx is clear. NECK:  No jugular venous distention, carotid upstroke brisk and symmetric, no bruits, no thyromegaly or adenopathy LUNGS:  Clear to auscultation bilaterally CHEST:  Shingles rash left chest to arm HEART:  RRR,  PMI not displaced or sustained,S1 and S2 within normal limits, no S3, no S4: no clicks, no rubs, no  murmurs ABD:  Soft, nontender. BS +, no masses or bruits. No hepatomegaly, no splenomegaly EXT:  2 + pulses throughout, no edema, no cyanosis no clubbing SKIN:  Warm and dry.  No rashes NEURO:  Alert and oriented x 3. Cranial nerves II through XII intact. PSYCH:  Cognitively intact      LABORATORY DATA: Lab Results  Component Value Date   WBC 6.9 09/04/2016   HGB 12.9 09/04/2016   HCT 36.3 09/04/2016   PLT 169 09/04/2016   GLUCOSE 100 (H) 09/04/2016   CHOL 162 09/04/2016   TRIG 288 (H) 09/04/2016   HDL 35 (L) 09/04/2016   LDLCALC 69 09/04/2016   ALT 20 09/04/2016   AST 27 09/04/2016   NA 142 09/04/2016   K 5.0 09/04/2016   CL 101 09/04/2016   CREATININE 0.84 09/04/2016   BUN 13 09/04/2016   CO2 25 09/04/2016   TSH 2.04 07/19/2015   INR 2.2 05/21/2012   HGBA1C  04/16/2007    5.6 (NOTE)   The ADA recommends the following therapeutic goals for glycemic   control related to Hgb A1C measurement:   Goal of Therapy:   < 7.0% Hgb A1C   Action Suggested:  > 8.0% Hgb A1C   Ref:  Diabetes Care, 22, Suppl. 1, 1999    Dated 12/30/16: cholesterol 159, triglycerides 125, HDL 35, LDL 99. CBC and chemistries normal Dated 04/15/17: A1c 5.6%.  Assessment / Plan: 1. Coronary disease with stable class 1 angina. She is status post CABG in 1986. Prior cardiac catheterization in 2009 showed a patent LIMA to the LAD but her other grafts were occluded. She did not have any suitable vessels for PCI or redo bypass. She is on aggressive antianginal therapy including amlodipine, nitrates, metoprolol, and Ranexa.   2. Atrial fibrillation. She had a single episode in March of 2013 which has not recurred. She is on chronic anticoagulation with Eliquis.   3. Hypertension, well controlled.   4. Hyperlipidemia. She is on chronic Lipitor and fish oil. She is due for follow up labs next month with Dr. Philip Aspen.  5. Shingles outbreak left chest.   I will follow up in 6 months

## 2017-11-22 ENCOUNTER — Other Ambulatory Visit: Payer: Self-pay | Admitting: Cardiology

## 2017-11-22 DIAGNOSIS — I2510 Atherosclerotic heart disease of native coronary artery without angina pectoris: Secondary | ICD-10-CM

## 2017-11-23 ENCOUNTER — Ambulatory Visit (INDEPENDENT_AMBULATORY_CARE_PROVIDER_SITE_OTHER): Payer: Medicare Other | Admitting: Cardiology

## 2017-11-23 ENCOUNTER — Encounter: Payer: Self-pay | Admitting: Cardiology

## 2017-11-23 VITALS — BP 138/78 | HR 79 | Ht 61.0 in | Wt 133.6 lb

## 2017-11-23 DIAGNOSIS — I2510 Atherosclerotic heart disease of native coronary artery without angina pectoris: Secondary | ICD-10-CM | POA: Diagnosis not present

## 2017-11-23 DIAGNOSIS — I209 Angina pectoris, unspecified: Secondary | ICD-10-CM

## 2017-11-23 DIAGNOSIS — E7800 Pure hypercholesterolemia, unspecified: Secondary | ICD-10-CM | POA: Diagnosis not present

## 2017-11-23 DIAGNOSIS — I10 Essential (primary) hypertension: Secondary | ICD-10-CM | POA: Diagnosis not present

## 2017-11-23 NOTE — Patient Instructions (Addendum)
Continue your current therapy  I will see you in 6 months.   

## 2017-11-24 DIAGNOSIS — R2681 Unsteadiness on feet: Secondary | ICD-10-CM | POA: Diagnosis not present

## 2017-11-24 DIAGNOSIS — Z6825 Body mass index (BMI) 25.0-25.9, adult: Secondary | ICD-10-CM | POA: Diagnosis not present

## 2017-11-24 DIAGNOSIS — I480 Paroxysmal atrial fibrillation: Secondary | ICD-10-CM | POA: Diagnosis not present

## 2017-11-24 DIAGNOSIS — Z1389 Encounter for screening for other disorder: Secondary | ICD-10-CM | POA: Diagnosis not present

## 2017-11-24 DIAGNOSIS — B029 Zoster without complications: Secondary | ICD-10-CM | POA: Diagnosis not present

## 2017-11-30 ENCOUNTER — Encounter

## 2018-01-11 ENCOUNTER — Telehealth: Payer: Self-pay

## 2018-01-11 DIAGNOSIS — E7849 Other hyperlipidemia: Secondary | ICD-10-CM | POA: Diagnosis not present

## 2018-01-11 DIAGNOSIS — I10 Essential (primary) hypertension: Secondary | ICD-10-CM | POA: Diagnosis not present

## 2018-01-11 NOTE — Telephone Encounter (Signed)
Returned call to patient [name] samples left at [facility] office front desk. 

## 2018-01-18 DIAGNOSIS — I480 Paroxysmal atrial fibrillation: Secondary | ICD-10-CM | POA: Diagnosis not present

## 2018-01-18 DIAGNOSIS — E7849 Other hyperlipidemia: Secondary | ICD-10-CM | POA: Diagnosis not present

## 2018-01-18 DIAGNOSIS — I2510 Atherosclerotic heart disease of native coronary artery without angina pectoris: Secondary | ICD-10-CM | POA: Diagnosis not present

## 2018-01-18 DIAGNOSIS — R2681 Unsteadiness on feet: Secondary | ICD-10-CM | POA: Diagnosis not present

## 2018-01-18 DIAGNOSIS — Z6826 Body mass index (BMI) 26.0-26.9, adult: Secondary | ICD-10-CM | POA: Diagnosis not present

## 2018-01-18 DIAGNOSIS — Z23 Encounter for immunization: Secondary | ICD-10-CM | POA: Diagnosis not present

## 2018-01-18 DIAGNOSIS — I10 Essential (primary) hypertension: Secondary | ICD-10-CM | POA: Diagnosis not present

## 2018-01-18 DIAGNOSIS — M545 Low back pain: Secondary | ICD-10-CM | POA: Diagnosis not present

## 2018-01-18 DIAGNOSIS — Z0000 Encounter for general adult medical examination without abnormal findings: Secondary | ICD-10-CM | POA: Diagnosis not present

## 2018-01-18 DIAGNOSIS — R740 Nonspecific elevation of levels of transaminase and lactic acid dehydrogenase [LDH]: Secondary | ICD-10-CM | POA: Diagnosis not present

## 2018-01-18 DIAGNOSIS — H9193 Unspecified hearing loss, bilateral: Secondary | ICD-10-CM | POA: Diagnosis not present

## 2018-01-18 DIAGNOSIS — R82998 Other abnormal findings in urine: Secondary | ICD-10-CM | POA: Diagnosis not present

## 2018-02-14 ENCOUNTER — Other Ambulatory Visit: Payer: Self-pay | Admitting: Cardiology

## 2018-03-01 ENCOUNTER — Telehealth: Payer: Self-pay | Admitting: Cardiology

## 2018-03-01 NOTE — Telephone Encounter (Signed)
Returned call to patient, advised no samples are available at this time.    She is not out at this time and has a few days worth left to take.  She states she will call back Wednesday to check.

## 2018-03-01 NOTE — Telephone Encounter (Signed)
° ° °  Patient calling the office for samples of medication:   1.  What medication and dosage are you requesting samples for? apixaban (ELIQUIS) 5 MG TABS tablet  2.  Are you currently out of this medication? YES

## 2018-03-08 ENCOUNTER — Telehealth: Payer: Self-pay | Admitting: Cardiology

## 2018-03-08 NOTE — Telephone Encounter (Signed)
New Message   Patient calling the office for samples of medication:   1.  What medication and dosage are you requesting samples for? apixaban (ELIQUIS) 5 MG TABS tablet  2.  Are you currently out of this medication? Two day remaining

## 2018-03-08 NOTE — Telephone Encounter (Signed)
Spoke with patient of Dr. Martinique. Explained no samples are available. Unsure when we will get any more. She has not looked in to patient assistance. Provided her with [facility] phone # and advised she call. Explained that alternatives are warfarin and brand-name Xarelto & Pradaxa  Routed to [facility] as Juluis Rainier

## 2018-03-09 NOTE — Telephone Encounter (Signed)
Spoke to patient Eliquis 5 mg samples left at [facility] office front desk. 

## 2018-04-14 ENCOUNTER — Other Ambulatory Visit: Payer: Self-pay | Admitting: Cardiology

## 2018-04-15 ENCOUNTER — Telehealth: Payer: Self-pay

## 2018-04-15 ENCOUNTER — Other Ambulatory Visit: Payer: Self-pay

## 2018-04-15 DIAGNOSIS — I209 Angina pectoris, unspecified: Secondary | ICD-10-CM

## 2018-04-15 NOTE — Telephone Encounter (Signed)
Called pt to let her know that her eliquis cannot be refilled until we receive some recent labs pt is compliant and will come later today

## 2018-04-16 ENCOUNTER — Other Ambulatory Visit: Payer: Self-pay

## 2018-04-16 LAB — COMPREHENSIVE METABOLIC PANEL
A/G RATIO: 1 — AB (ref 1.2–2.2)
ALT: 22 IU/L (ref 0–32)
AST: 30 IU/L (ref 0–40)
Albumin: 3.8 g/dL (ref 3.6–4.6)
Alkaline Phosphatase: 83 IU/L (ref 39–117)
BILIRUBIN TOTAL: 0.6 mg/dL (ref 0.0–1.2)
BUN/Creatinine Ratio: 12 (ref 12–28)
BUN: 11 mg/dL (ref 8–27)
CHLORIDE: 100 mmol/L (ref 96–106)
CO2: 26 mmol/L (ref 20–29)
Calcium: 9.2 mg/dL (ref 8.7–10.3)
Creatinine, Ser: 0.89 mg/dL (ref 0.57–1.00)
GFR calc non Af Amer: 61 mL/min/{1.73_m2} (ref >59)
GFR, EST AFRICAN AMERICAN: 70 mL/min/{1.73_m2} (ref >59)
Globulin, Total: 4 g/dL (ref 1.5–4.5)
Glucose: 75 mg/dL (ref 65–99)
POTASSIUM: 4.3 mmol/L (ref 3.5–5.2)
SODIUM: 141 mmol/L (ref 134–144)
TOTAL PROTEIN: 7.8 g/dL (ref 6.0–8.5)

## 2018-04-16 MED ORDER — APIXABAN 5 MG PO TABS: 5.0000 mg | ORAL_TABLET | Freq: Two times a day (BID) | ORAL | 6 refills | Status: DC

## 2018-04-16 NOTE — Telephone Encounter (Signed)
Pt is a 84 yr old female who last saw Dr. Martinique on 11/23/17, wt at that visit was 60.6Kg. Discussed with Raquel, Pharmacist will keep pt on 5mg  BID until next MD visit with weight check.

## 2018-04-23 DIAGNOSIS — Z6826 Body mass index (BMI) 26.0-26.9, adult: Secondary | ICD-10-CM | POA: Diagnosis not present

## 2018-04-23 DIAGNOSIS — R740 Nonspecific elevation of levels of transaminase and lactic acid dehydrogenase [LDH]: Secondary | ICD-10-CM | POA: Diagnosis not present

## 2018-04-23 DIAGNOSIS — H9193 Unspecified hearing loss, bilateral: Secondary | ICD-10-CM | POA: Diagnosis not present

## 2018-04-23 DIAGNOSIS — I480 Paroxysmal atrial fibrillation: Secondary | ICD-10-CM | POA: Diagnosis not present

## 2018-04-23 DIAGNOSIS — R2681 Unsteadiness on feet: Secondary | ICD-10-CM | POA: Diagnosis not present

## 2018-04-23 DIAGNOSIS — Z1389 Encounter for screening for other disorder: Secondary | ICD-10-CM | POA: Diagnosis not present

## 2018-04-27 ENCOUNTER — Other Ambulatory Visit: Payer: Self-pay | Admitting: Cardiology

## 2018-05-10 ENCOUNTER — Other Ambulatory Visit: Payer: Self-pay | Admitting: Cardiology

## 2018-05-11 IMAGING — CT CT MAXILLOFACIAL W/O CM
3 of 8 series · 14 of 47 positions shown, 17 images · non-contrast
Comparison: None.

CLINICAL DATA: Facial and head trauma secondary to a fall today.
The patient struck the left side of the face on the left side of the
forehead and now has bruising and swelling.

EXAM:
CT MAXILLOFACIAL WITHOUT CONTRAST
CT CERVICAL SPINE WITHOUT CONTRAST
TECHNIQUE: Multidetector CT imaging of the maxillofacial structures was
performed. Multiplanar CT image reconstructions were also generated.
A small metallic BB was placed on the right temple in order to
reliably differentiate right from left.
Multidetector CT imaging of the cervical spine was performed without
intravenous contrast. Multiplanar CT image reconstructions were also
generated.

[Series 8: coronal soft tissue · coronal · 0.36mm/px · 3 of 112 slices shown]
[im 23/112  bone]
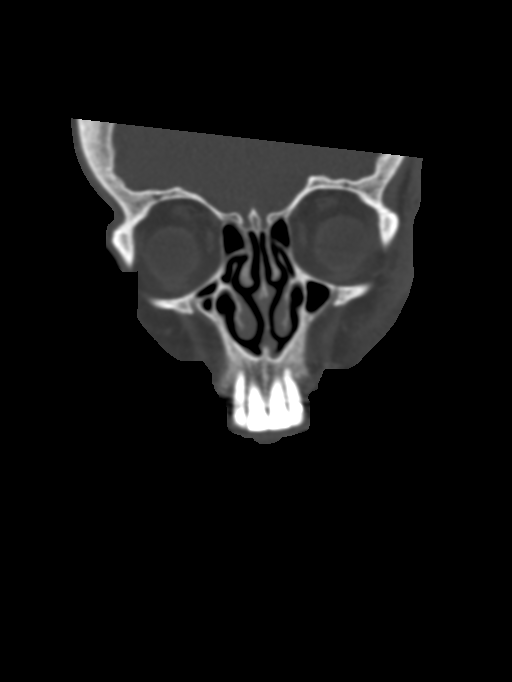
[im 45/112  bone]
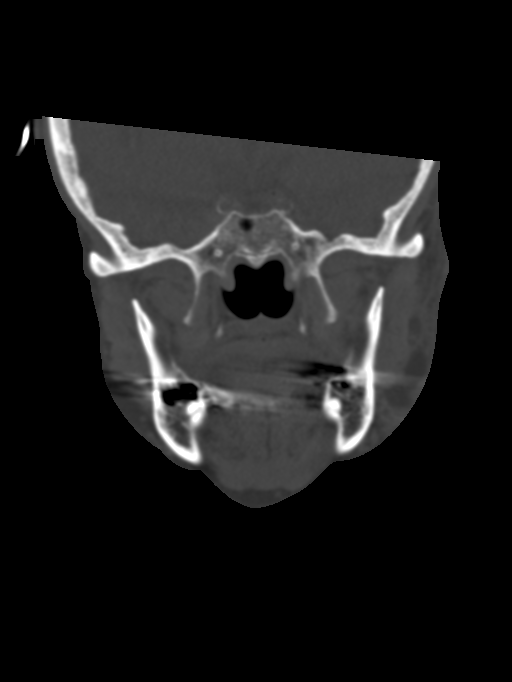
[im 67/112  bone]
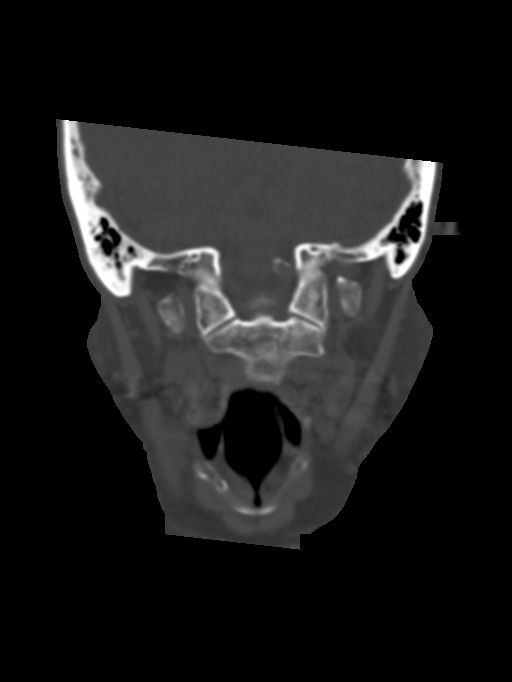

[Series 13: c_spine 2.0 3 st · axial · 0.29mm/px · z∈[+928,+1074]mm · 10 of 85 slices shown, 13 images]
[im 6/85  brain]
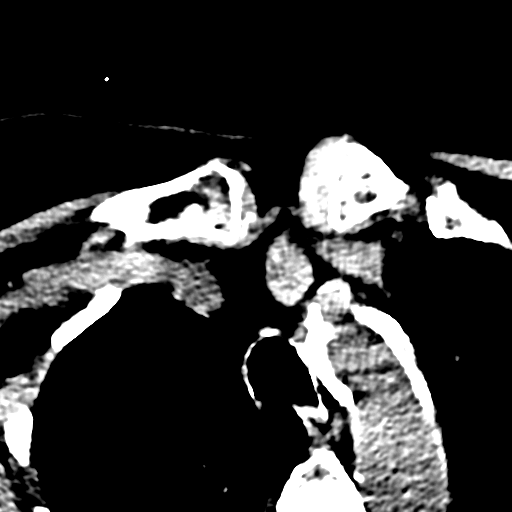
[im 6/85  bone]
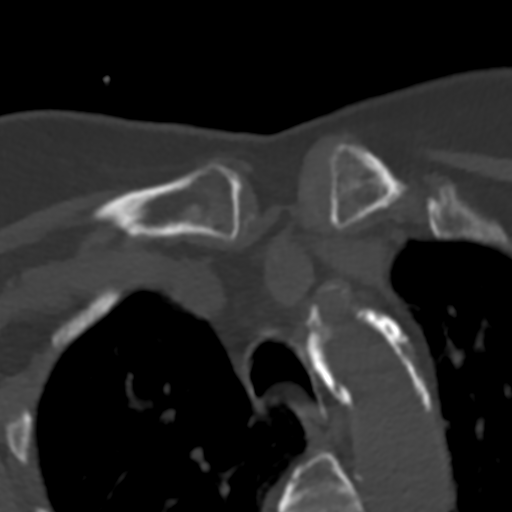
[im 17/85  bone]
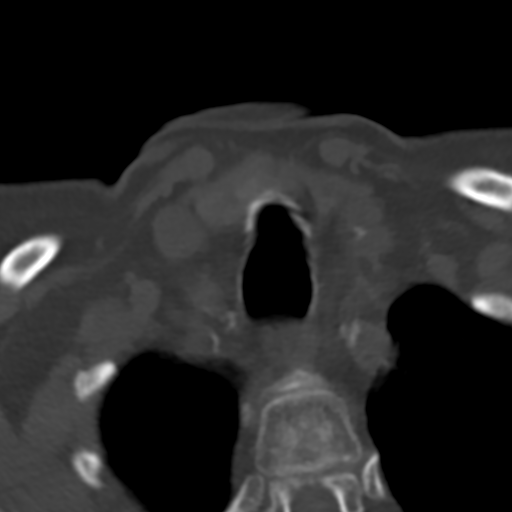
[im 23/85  bone]
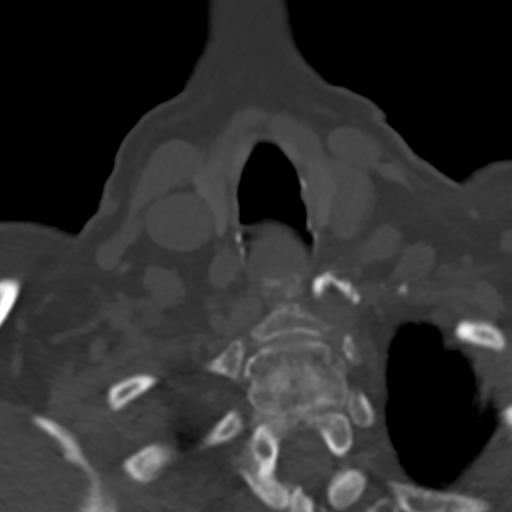
[im 29/85  bone]
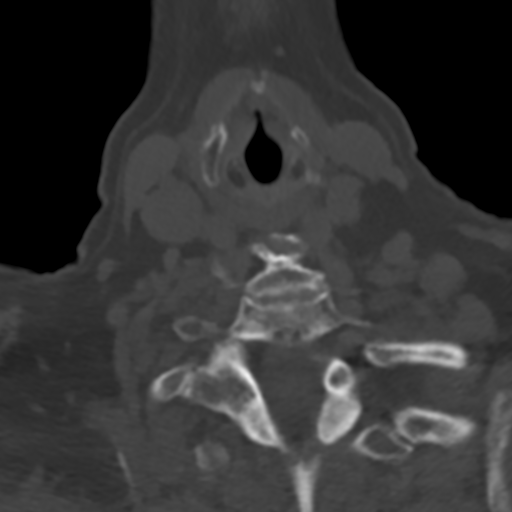
[im 40/85  brain]
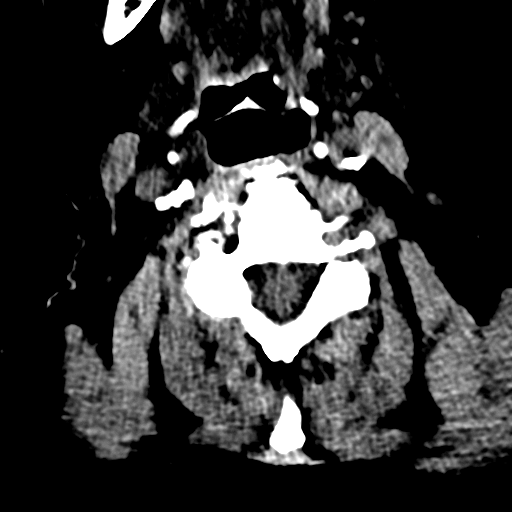
[im 40/85  bone]
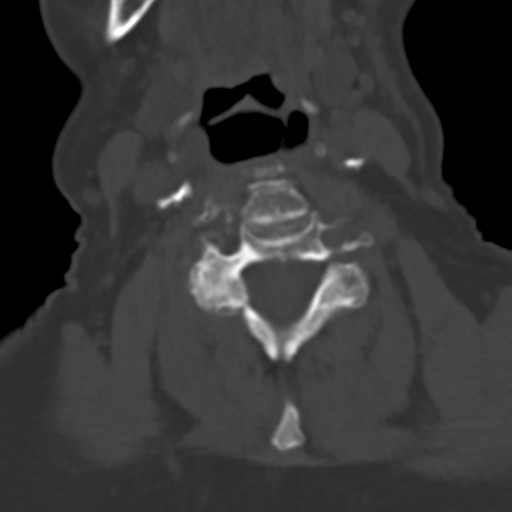
[im 45/85  bone]
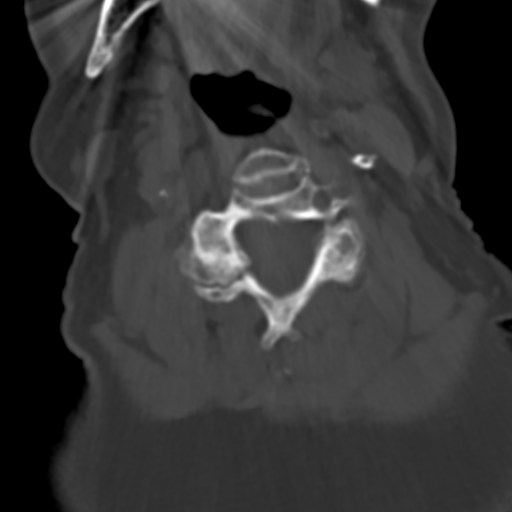
[im 57/85  bone]
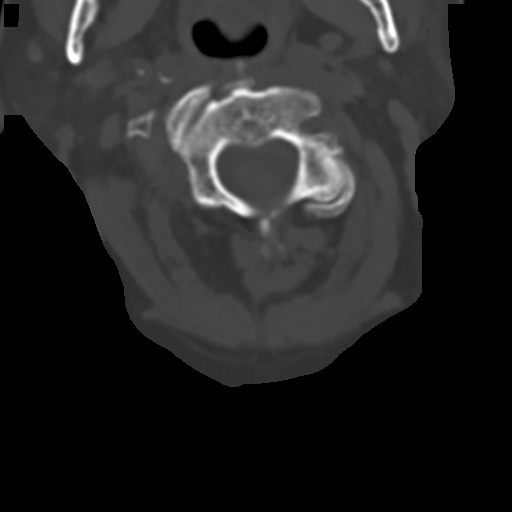
[im 62/85  bone]
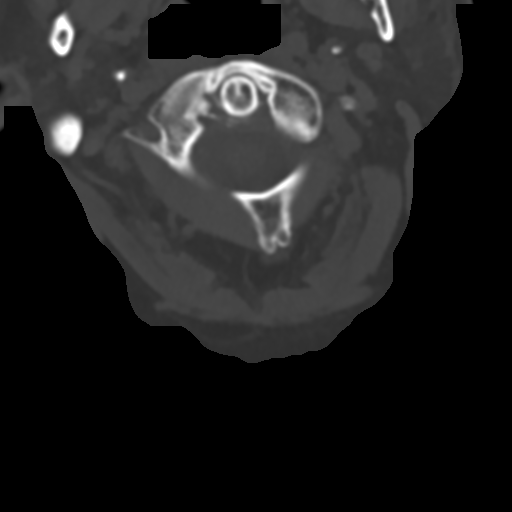
[im 68/85  brain]
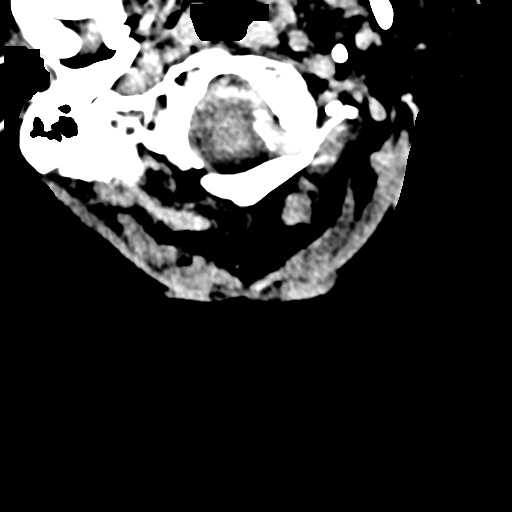
[im 68/85  bone]
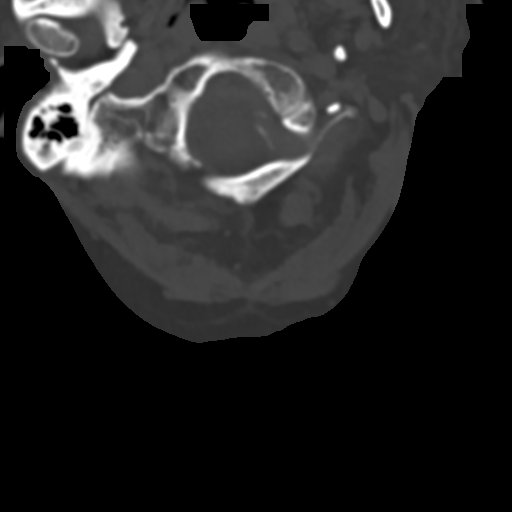
[im 79/85  bone]
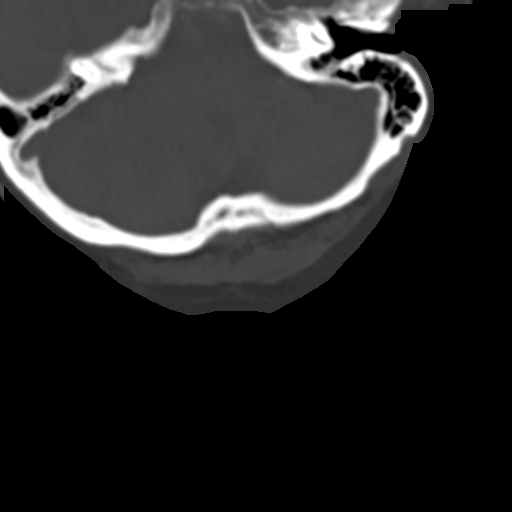

[Series 15: sagittal bone · sagittal · 0.45mm/px · 1 of 61 slices shown]
[im 31/61  bone]
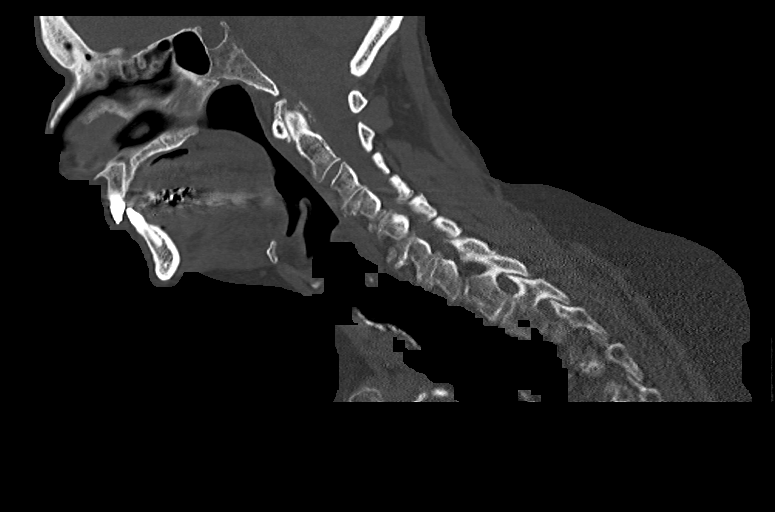

[14 of 47 positions shown; findings below may reference images not displayed]

FINDINGS: CT MAXILLOFACIAL FINDINGS

Osseous: No fracture or mandibular dislocation. No destructive
process.

Orbits: Negative. No traumatic or inflammatory finding.

Sinuses: Clear.

Soft tissues: Prominent soft tissue contusion over the left cheek.
No definable hematoma at that site. There is a soft tissue hematoma
overlying left frontal bone. Prominent soft tissue swelling adjacent
to the left inferior orbital rim

Limited intracranial: Negative

CT CERVICAL FINDINGS

Alignment: No acute abnormalities.

Skull base and vertebrae: Moderately severe degenerative facet
arthritis at C2-3 through C4-5 on the left and C7-T1 on the left and
C3-4 through C5-6 on the right and to a slightly lesser degree at
C6-7 and C7-T1 on the right. Calcifications in the distal vertebral
arteries.

Soft tissues and spinal canal: No prevertebral fluid or swelling. No
visible canal hematoma.

Disc levels: Diffuse degenerative disc disease in the cervical spine
without neural impingement.

Upper chest: Negative except for extensive aortic atherosclerosis.

Other: None
IMPRESSION: 1. Soft tissue contusion of the left cheek. Soft tissue hematoma
over the left frontal bone.
2. No acute bone abnormality of the facial bones.
3. No acute abnormality of the cervical spine. Multilevel
degenerative disc and joint disease.
4. Aortic atherosclerosis.

## 2018-05-14 ENCOUNTER — Telehealth: Payer: Self-pay | Admitting: Cardiology

## 2018-05-14 NOTE — Telephone Encounter (Signed)
Spoke to patient . She states she does not have a nose bleed now ,but last noght  Lasting about 1 and 1/2 hours. RN suggest patient to use saline nasal spray, purchase a humidifier if possible.  Can also use Afrin while the nose is bleeding but not on continuous bases . Patient states she's able to apply pressure to make it stop.  RN informed patient to continue to do that and above.   patient wanted to know if she should stop Eliquis.  RN informed patient  To continue and will have to defer to Dr Martinique and or pharmacist  and call her back. She verbalized understanding

## 2018-05-14 NOTE — Telephone Encounter (Signed)
Spoke to patient [name]'s recommendation's given.Advised to call back if continues to have a nose bleed.

## 2018-05-14 NOTE — Telephone Encounter (Signed)
Agree  Lennie Dunnigan MD, FACC   

## 2018-05-14 NOTE — Telephone Encounter (Signed)
Agreed with recommendations given. Keeping moisturized nostril will allow healing and stop recurrent bleeding.   *Okay to HOLD Eliquis for 1 day ONLY due to active bleeding,  then continue Eliquis as prescribed*

## 2018-05-14 NOTE — Telephone Encounter (Signed)
Pt is having bad nosebleeds for 3-4 weeks,  so much blood it drips down the back of her throat. She also had a large blood clot about the size of her pinky finger. She wants to know if it is still safe for her to be taking Eliquis

## 2018-05-17 ENCOUNTER — Telehealth: Payer: Self-pay | Admitting: Cardiology

## 2018-05-17 ENCOUNTER — Other Ambulatory Visit: Payer: Self-pay | Admitting: Cardiology

## 2018-05-17 DIAGNOSIS — I2510 Atherosclerotic heart disease of native coronary artery without angina pectoris: Secondary | ICD-10-CM

## 2018-05-17 NOTE — Telephone Encounter (Signed)
Spoke with patient and she has been having nose bleeds for several days. She did hold her Eliquis on Friday and Saturday evening she had a nose bleed from around 8:00 pm to 1:00 am. When it is bleeding it will fill her hands up and run down the back of her throat. She did not have nose bleed yesterday but no Eliquis since Saturday night. Will forward to Dr Martinique for review

## 2018-05-17 NOTE — Telephone Encounter (Signed)
I would hold Eliquis for 2 more days. If no further bleeding then she can resume.  Peter Martinique MD, [facility]

## 2018-05-17 NOTE — Telephone Encounter (Signed)
Returned call to patient Dr.Jordan's advice given.Advised to call back if nose bleed continues.

## 2018-05-17 NOTE — Telephone Encounter (Signed)
New Message         Patient is calling back today concerning her nose bleeds, patient was instructed not to take "Eliquis' she stopped over the weekend and started back and the nose bleeds have continued. Pls call and advise.

## 2018-05-21 ENCOUNTER — Telehealth: Payer: Self-pay

## 2018-05-21 NOTE — Telephone Encounter (Signed)
   Primary Cardiologist: Dr.Jordan    Patient contacted.  History reviewed.  No symptoms to suggest any unstable cardiac conditions.  Based on discussion, with current pandemic situation, we will be postponing this 05/26/18 appointment for [facility].  If symptoms change, she has been instructed to contact our office.   Appointment rescheduled to 08/13/18 at 2:20 pm.  Kathyrn Lass, LPN  8/33/8250 5:39 PM         .

## 2018-05-26 ENCOUNTER — Ambulatory Visit: Payer: Medicare Other | Admitting: Cardiology

## 2018-06-04 ENCOUNTER — Telehealth: Payer: Self-pay | Admitting: Cardiology

## 2018-06-04 NOTE — Telephone Encounter (Signed)
Spoke to patient Dr.Jordan's advice given.Advised to call back if she continues to have nose bleeds.

## 2018-06-04 NOTE — Telephone Encounter (Signed)
New message   Pt c/o medication issue:  1. Name of Medication: apixaban (ELIQUIS) 5 MG TABS tablet  2. How are you currently taking this medication (dosage and times per day)? Twice daily  3. Are you having a reaction (difficulty breathing--STAT)? No   4. What is your medication issue? Patient states that she is having nose bleeds and believes that this medication is causing this issue.

## 2018-06-04 NOTE — Telephone Encounter (Signed)
She can hold Eliquis for 2-3 days until bleeding resolved.  Peter Martinique MD, [facility]

## 2018-06-04 NOTE — Telephone Encounter (Signed)
Returned call to patient.She stated she had a bad nose bleed yesterday and another one this morning.Stated she is not going to take her am dose of eliquis.Advised I will send message to [place] for advice.

## 2018-06-21 ENCOUNTER — Other Ambulatory Visit: Payer: Self-pay | Admitting: Cardiology

## 2018-06-21 ENCOUNTER — Telehealth: Payer: Self-pay | Admitting: Cardiology

## 2018-06-21 DIAGNOSIS — I2510 Atherosclerotic heart disease of native coronary artery without angina pectoris: Secondary | ICD-10-CM

## 2018-06-21 MED ORDER — NITROGLYCERIN 0.4 MG SL SUBL: 0.4000 mg | SUBLINGUAL_TABLET | SUBLINGUAL | 3 refills | Status: DC | PRN

## 2018-06-21 MED ORDER — ISOSORBIDE MONONITRATE ER 60 MG PO TB24: 90.0000 mg | ORAL_TABLET | Freq: Every day | ORAL | 1 refills | Status: DC

## 2018-06-21 MED ORDER — NITROGLYCERIN 0.4 MG SL SUBL: 0.4000 mg | SUBLINGUAL_TABLET | SUBLINGUAL | 3 refills | Status: AC | PRN

## 2018-06-21 NOTE — Telephone Encounter (Signed)
Pt called to report that she has been having chest "aching" for about 2 weeks on her left side and seems to be worsening.. she says it happens when she is up doing things in her house and if she rushes to the bathroom.. she has been taking Nitro and it gives her relief but the last few days she has had to take 2 before it goes away..   She denies other symptoms associated with it but feeling anxious that it is not going away.  She is also not out of her nitro. She is not having pain now so I advised her to rest as I forward to Dr. Ofilia Neas and if her pain returns in the meantime to please consider calling EMS.. her husband is also there with her.   She is unaware of her BP and HR.

## 2018-06-21 NOTE — Telephone Encounter (Signed)
Note received from Triage. I called the patient back and her main concern was that she needed a refill on her nitro. She is not overly concerned about her chest pain. Chart review shows she has chronic angina.  Over the past 3 weeks, she has had to increase her nitro to 1-2 tablets almost daily. She states her blood pressure "is good" but can't recall a reading. Since she has been taking 2 nitro almost daily without symptoms of hypotension, I felt comfortable giving her the option to increase her imdur. She agreed. I refilled her nitro and increased her imdur to 90 mg daily. She will call back if her chest pain persists. I also discussed precautions for hypotension should she continue taking nitro. She expressed understanding of the plan.  I will forward this note to Dr. Martinique.

## 2018-06-21 NOTE — Telephone Encounter (Signed)
Opened in error

## 2018-06-21 NOTE — Telephone Encounter (Signed)
° ° °  Pt c/o of Chest Pain: STAT if CP now or developed within 24 hours  1. Are you having CP right now? no  2. Are you experiencing any other symptoms (ex. SOB, nausea, vomiting, sweating)? no 3. How long have you been experiencing CP?  weeks  4. Is your CP continuous or coming and going? Coming and going  5. Have you taken Nitroglycerin? yes ?

## 2018-06-21 NOTE — Addendum Note (Signed)
Addended byBarry Brunner on: 06/21/2018 05:13 PM   Modules accepted: Orders

## 2018-06-21 NOTE — Telephone Encounter (Signed)
° ° ° °*  STAT* If patient is at the pharmacy, call can be transferred to refill team.   1. Which medications need to be refilled? (please list name of each medication and dose if known) Nitro  2. Which pharmacy/location (including street and city if local pharmacy) is medication to be sent to?[place], [place] AT [place]  3. Do they need a 30 day or 90 day supply? [place]

## 2018-06-25 ENCOUNTER — Telehealth: Payer: Self-pay | Admitting: Cardiology

## 2018-06-25 DIAGNOSIS — I2510 Atherosclerotic heart disease of native coronary artery without angina pectoris: Secondary | ICD-10-CM

## 2018-06-25 MED ORDER — ISOSORBIDE MONONITRATE ER 60 MG PO TB24: 120.0000 mg | ORAL_TABLET | Freq: Every day | ORAL | 3 refills | Status: AC

## 2018-06-25 MED ORDER — METOPROLOL TARTRATE 25 MG PO TABS: 37.5000 mg | ORAL_TABLET | Freq: Two times a day (BID) | ORAL | 3 refills | Status: DC

## 2018-06-25 NOTE — Telephone Encounter (Signed)
Pt called back because she said she's had to take the Nitroglycerin at least once every night. She is OK during the day. She doesn't understand why this is only happening at night

## 2018-06-25 NOTE — Telephone Encounter (Signed)
Pt called to report that she increased her Imdur to 90mg  per Levada Dy Duke's request on 06/21/18... she still has chest pain every night and has been using the SL nitro up to 3 times since 06/21/18.[name] she feels fine during the day..   She says the pain happens when she lays down and feels sharp in her chest in the center.. she denies SOB and dizziness with it... she does not know what her BP is running.   Pt is upset the pain is not letting her sleep well at night.. she feels fine today... she denies GI symptoms such as reflux.  Will forward to Dr. Ofilia Neas for advice.

## 2018-06-25 NOTE — Telephone Encounter (Signed)
Spoke with pt, she reports chest pain and SOB when walking from one end of the house to the other. For the last 2 nights it has woke her from her sleep. Last night it bothered her 4 times and it took 2 NTG each time for it to go away. Her isosorbide was increased to 90 mg daily. Will discuss with dr hochreinDOD

## 2018-06-25 NOTE — Telephone Encounter (Signed)
Pt called stated she has a question about her medication and wants to know if anything can be increased to help with the issue she is having at night lately.  Pt asked if she would get a call back today.?  Please give her a call.

## 2018-06-25 NOTE — Addendum Note (Signed)
Addended by: Cristopher Estimable on: 06/25/2018 04:52 PM   Modules accepted: Orders

## 2018-06-25 NOTE — Telephone Encounter (Signed)
Discussed with dr hochrein, medication changes discussed in detail with the patient. New script sent to the pharmacy. Explained to the patient if continues to have issues after the increase in medications she needs to go to the ER. Patient voiced understanding

## 2018-06-25 NOTE — Telephone Encounter (Signed)
Pt takes triple the amount at night than she takes during the day. She said she just wakes up hurting and can not go back to sleep.

## 2018-06-28 NOTE — Telephone Encounter (Signed)
   Dr Martinique feels she needs to be seen.  Called schedulers to let them know.   Dr Debara Pickett is full today, Dr Claiborne Billings is DOD tomorrow.  She will be offered an appointment tomorrow w/ Dr Claiborne Billings, or encouraged to go to the ER today.   Rosaria Ferries, PA-C 06/28/2018 1:13 PM Beeper [phone]

## 2018-06-29 ENCOUNTER — Encounter: Payer: Self-pay | Admitting: Cardiovascular Disease

## 2018-06-29 ENCOUNTER — Other Ambulatory Visit: Payer: Self-pay

## 2018-06-29 ENCOUNTER — Ambulatory Visit (INDEPENDENT_AMBULATORY_CARE_PROVIDER_SITE_OTHER): Payer: Medicare Other | Admitting: Cardiovascular Disease

## 2018-06-29 VITALS — BP 122/62 | HR 64 | Ht 61.0 in | Wt 133.6 lb

## 2018-06-29 DIAGNOSIS — I25110 Atherosclerotic heart disease of native coronary artery with unstable angina pectoris: Secondary | ICD-10-CM

## 2018-06-29 DIAGNOSIS — I10 Essential (primary) hypertension: Secondary | ICD-10-CM | POA: Diagnosis not present

## 2018-06-29 DIAGNOSIS — E7800 Pure hypercholesterolemia, unspecified: Secondary | ICD-10-CM

## 2018-06-29 DIAGNOSIS — K219 Gastro-esophageal reflux disease without esophagitis: Secondary | ICD-10-CM | POA: Diagnosis not present

## 2018-06-29 DIAGNOSIS — I209 Angina pectoris, unspecified: Secondary | ICD-10-CM | POA: Diagnosis not present

## 2018-06-29 MED ORDER — METOPROLOL TARTRATE 25 MG PO TABS: ORAL_TABLET | ORAL | 3 refills | Status: DC

## 2018-06-29 MED ORDER — RANOLAZINE ER 1000 MG PO TB12: 1000.0000 mg | ORAL_TABLET | Freq: Two times a day (BID) | ORAL | 1 refills | Status: AC

## 2018-06-29 NOTE — Progress Notes (Signed)
Cardiology Office Note    Date:  06/29/2018   ID:  [name], DOB 1934/12/02, MRN [id number]  PCP:  Leanna Battles, MD  Cardiologist:  Peter Martinique, MD  DOD evaluation for recurrent angina  History of Present Illness:  [name] is a 84 y.o. female who is followed by Dr. Martinique for CAD and chronic chest pain.  She underwent CABG revascularization surgery in 1986.  Her last catheterization in 2009 showed a patent LIMA graft to the LAD but all other grafts were occluded.  She did not have any suitable vessels for PCI or bypass.  She had an episode of atrial fibrillation in March 2013 and since that time has been on chronic anticoagulation therapy.  She has been treated medically for her CAD and chronic anginal symptomatology.  She noted significant recent beneficial response to Ranexa following its initiation.  Over the last several weeks, she has noticed recurrent episodes of progressive chest pain which are precipitated by activity usually walking.  She denies any rest or nocturnal symptoms.  She has been maintained on Ranexa 500 mg twice a day, metoprolol tartrate 37.5 mg twice a day, amlodipine 5 mg daily, and had previously been on isosorbide 60 mg.  Her Imdur was subsequently increased to 90 mg and then later up to 120 mg.  She has not noticed any major beneficial response so far.  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  Presently, she denies any PND orthopnea.  She denies any presyncope or syncope.  She is unaware of any recurrent episodes of atrial fibrillation.  She has continued to be on atorvastatin and over-the-counter fish oil for hyperlipidemia.  She denies any bleeding on Eliquis.   Past Medical History:  Diagnosis Date   Anxiety    Atrial fibrillation ([place])    Coronary artery disease    STATUS POST CABG   DA (degenerative arthritis)    Hearing loss    Heartburn     Hyperlipidemia    Hypertension    Ovarian cyst    h/o complex right ovarian cyst/followed yearly   Skin cancer     Past Surgical History:  Procedure Laterality Date   CARDIAC CATHETERIZATION  04/16/2007   NORMAL LEFT VENTRICULAR SIZE AND CONTRACTILITY WITH NORMAL SYSTOLIC FUNCTION. EF 60%. THERE IS MILD TO MODERATE MITRAL INSUFFICIENCY   CHOLECYSTECTOMY     CORONARY ARTERY BYPASS GRAFT  1986   X3, LIMA GRAFT TO THE LAD. SHE HAS POOR TARGETS   HYSTEROSCOPY  8/04   polyp resection   NEPHRECTOMY     LEFT   SKIN CANCER EXCISION     TONSILLECTOMY     TUBAL LIGATION      Current Medications: Outpatient Medications Prior to Visit  Medication Sig Dispense Refill   amLODipine (NORVASC) 5 MG tablet Take 1 tablet (5 mg total) by mouth daily. KEEP OV. 90 tablet 0   apixaban (ELIQUIS) 5 MG TABS tablet Take 1 tablet (5 mg total) by mouth 2 (two) times daily. 60 tablet 6   atorvastatin (LIPITOR) 40 MG tablet TAKE 1 TABLET BY MOUTH AT BEDTIME 90 tablet 1   isosorbide mononitrate (IMDUR) 60 MG 24 hr tablet Take 2 tablets (120 mg total) by mouth daily. 180 tablet 3   nitroGLYCERIN (NITROSTAT) 0.4 MG SL tablet Place 1 tablet (0.4 mg total) under the tongue every 5 (five) minutes as needed for chest pain.  25 tablet 3   NITROSTAT 0.4 MG SL tablet Reported on 08/09/2015     nystatin cream (MYCOSTATIN) APPLY TO AFFECTED AREA TWICE DAILY FOR 7 DAYS 30 g 1   Omega-3 Fatty Acids (FISH OIL) 1000 MG CAPS Take by mouth daily.       omeprazole (PRILOSEC) 20 MG capsule Take 20 mg by mouth daily.     trimethoprim (TRIMPEX) 100 MG tablet Take 1 tablet (100 mg total) by mouth daily. 90 tablet 4   metoprolol tartrate (LOPRESSOR) 25 MG tablet Take 1.5 tablets (37.5 mg total) by mouth 2 (two) times daily. KEEP OV. 270 tablet 3   ranolazine (RANEXA) 500 MG 12 hr tablet TAKE 1 TABLET BY MOUTH TWICE DAILY 60 tablet 5   losartan (COZAAR) 100 MG tablet Take 1 tablet (100 mg total) by mouth daily. 90  tablet 3   No facility-administered medications prior to visit.      Allergies:   Demerol; Niacin; Niaspan [niacin er]; Sulfa antibiotics; and Tape   Social History   Socioeconomic History   Marital status: Married    Spouse name: Not on file   Number of children: 1   Years of education: Not on file   Highest education level: Not on file  Occupational History   Occupation: Designer, jewellery: RETIRED    Comment: retired  Scientist, product/process development strain: Not on Training and development officer insecurity:    Worry: Not on file    Inability: Not on Lexicographer needs:    Medical: Not on file    Non-medical: Not on file  Tobacco Use   Smoking status: Never Smoker   Smokeless tobacco: Never Used  Substance and Sexual Activity   Alcohol use: No   Drug use: No   Sexual activity: Yes    Birth control/protection: Surgical    Comment: BTL  Lifestyle   Physical activity:    Days per week: Not on file    Minutes per session: Not on file   Stress: Not on file  Relationships   Social connections:    Talks on phone: Not on file    Gets together: Not on file    Attends religious service: Not on file    Active member of club or organization: Not on file    Attends meetings of clubs or organizations: Not on file    Relationship status: Not on file  Other Topics Concern   Not on file  Social History Narrative   Not on file     Family History:  The patient's*family history includes Breast cancer in her sister; Hypertension in her father.   ROS General: Negative; No fevers, chills, or night sweats;  HEENT: Negative; No changes in vision or hearing, sinus congestion, difficulty swallowing Pulmonary: Negative; No cough, wheezing, shortness of breath, hemoptysis Cardiovascular: see HPI GI: Negative; No nausea, vomiting, diarrhea, or abdominal pain GU: Negative; No dysuria, hematuria, or difficulty voiding Musculoskeletal: Negative; no myalgias, joint pain, or  weakness Hematologic/Oncology: Negative; no easy bruising, bleeding Endocrine: Negative; no heat/cold intolerance; no diabetes Neuro: Negative; no changes in balance, headaches Skin: history of shingles Psychiatric: Negative; No behavioral problems, depression Sleep: Negative; No snoring, daytime sleepiness, hypersomnolence, bruxism, restless legs, hypnogognic hallucinations, no cataplexy Other comprehensive 14 point system review is negative.   PHYSICAL EXAM:   VS:  BP 122/62    Pulse 64    Ht '5\' 1"'$  (1.549 m)    Wt 133  lb 9.6 oz (60.6 kg)    LMP 03/03/1992    BMI 25.24 kg/m     Repeat blood pressure by me was 124/68  Wt Readings from Last 3 Encounters:  06/29/18 133 lb 9.6 oz (60.6 kg)  11/23/17 133 lb 9.6 oz (60.6 kg)  09/29/17 142 lb (64.4 kg)    General: Alert, oriented, no distress.  Skin: normal turgor, no rashes, warm and dry HEENT: Normocephalic, atraumatic. Pupils equal round and reactive to light; sclera anicteric; extraocular muscles intact;  Nose without nasal septal hypertrophy Mouth/Parynx second Neck: sta she was wearing a mask in her and her pharynx was not investigated   No JVD, no carotid bruits; normal carotid upstroke Lungs: clear to ausculatation and percussion; no wheezing or rales Chest wall: without tenderness to palpitation Heart: PMI not displaced, RRR, s1 s2 normal, 1/6 systolic murmur, no diastolic murmur, no rubs, gallops, thrills, or heaves Abdomen: soft, nontender; no hepatosplenomehaly, BS+; abdominal aorta nontender and not dilated by palpation. Back: no CVA tenderness Pulses 2+ Musculoskeletal: full range of motion, normal strength, no joint deformities Extremities: no clubbing cyanosis or edema, Homan's sign negative  Neurologic: grossly nonfocal; Cranial nerves grossly wnl Psychologic: Normal mood and affect   Studies/Labs Reviewed:   EKG:  EKG is ordered today.  ECG (independently read by me): Normal sinus rhythm at 64 bpm.  Nonspecific  ST-T changes inferolaterally QTc interval 431 ms. When compared to the patient's prior ECG of September 05, 2016, the ST-T changes have slightly progressed.  Recent Labs: BMP Latest Ref Rng & Units 04/15/2018 09/04/2016 07/19/2015  Glucose 65 - 99 mg/dL 75 100(H) 91  BUN 8 - 27 mg/dL '11 13 13  '$ Creatinine 0.57 - 1.00 mg/dL 0.89 0.84 0.75  BUN/Creat Ratio 12 - '28 12 15 '$ -  Sodium 134 - 144 mmol/L 141 142 139  Potassium 3.5 - 5.2 mmol/L 4.3 5.0 4.3  Chloride 96 - 106 mmol/L 100 101 102  CO2 20 - 29 mmol/L '26 25 26  '$ Calcium 8.7 - 10.3 mg/dL 9.2 9.3 9.2     Hepatic Function Latest Ref Rng & Units 04/15/2018 09/04/2016 07/19/2015  Total Protein 6.0 - 8.5 g/dL 7.8 8.0 7.6  Albumin 3.6 - 4.6 g/dL 3.8 4.3 4.1  AST 0 - 40 IU/L '30 27 20  '$ ALT 0 - 32 IU/L '22 20 17  '$ Alk Phosphatase 39 - 117 IU/L 83 74 69  Total Bilirubin 0.0 - 1.2 mg/dL 0.6 0.6 0.8  Bilirubin, Direct 0.00 - 0.40 mg/dL - 0.17 -    CBC Latest Ref Rng & Units 09/04/2016 03/30/2015 06/12/2014  WBC 3.4 - 10.8 x10E3/uL 6.9 9.1 7.0  Hemoglobin 11.1 - 15.9 g/dL 12.9 14.9 13.6  Hematocrit 34.0 - 46.6 % 36.3 41.9 39.5  Platelets 150 - 379 x10E3/uL 169 183 160   Lab Results  Component Value Date   MCV 99 (H) 09/04/2016   MCV 101.5 (H) 03/30/2015   MCV 101.0 (H) 06/12/2014   Lab Results  Component Value Date   TSH 2.04 07/19/2015   Lab Results  Component Value Date   HGBA1C  04/16/2007    5.6 (NOTE)   The ADA recommends the following therapeutic goals for glycemic   control related to Hgb A1C measurement:   Goal of Therapy:   < 7.0% Hgb A1C   Action Suggested:  > 8.0% Hgb A1C   Ref:  Diabetes Care, 22, Suppl. 1, 1999     BNP No results found for: BNP  ProBNP  No results found for: PROBNP   Lipid Panel     Component Value Date/Time   CHOL 162 09/04/2016 1517   TRIG 288 (H) 09/04/2016 1517   HDL 35 (L) 09/04/2016 1517   CHOLHDL 4.6 (H) 09/04/2016 1517   CHOLHDL 4.7 06/12/2014 1117   VLDL 35 06/12/2014 1117   LDLCALC 69 09/04/2016  1517     RADIOLOGY: No results found.   Additional studies/ records that were reviewed today include:  I reviewed the records of Dr. Martinique    ASSESSMENT:    1. Essential hypertension   2. Coronary artery disease involving native coronary artery of native heart with unstable angina pectoris ([place])   3. Pure hypercholesterolemia   4. Gastroesophageal reflux disease without esophagitis     PLAN:  This Bohall is an 84 year old female patient of Dr. Martinique who underwent CABG revascularization surgery in 1986, 34 years ago.  At last catheterization in 2009 her only patent graft was a LIMA to LAD and all other grafts were occluded.  She has been treated with medical therapy since there were no suitable vessels for PCI or bypass.  She has a history of atrial fibrillation and has been on anticoagulation.  Recently, she has experienced an increase in anginal symptomatology over the past 2 to 3 weeks with increasing chest pain all exertionally precipitated but with not significant fast ambulation.  Despite her isosorbide mononitrate dose being increased from 60, to 90, and ultimately to 120 mg she has continued to experience all symptomatology.  Presently, her blood pressure is stable.  Her ECG does not reveal acute ST-T abnormalities but does show subtle additional ST changes inferolaterally.  Her QTc interval was normal at 431 ms.  I am recommending titration of Ranexa to 1000 mg twice a day which should provide significant additional benefit.  Since most of her symptoms occur during activity I have recommended titration of metoprolol to 50 mg in the morning and at present she will continue the 37.5 mg at night.  If she continues to experience similar symptomatology and her heart rate does not significantly decrease titration to 50 twice daily can be done.  I am recommending that she undergo a follow-up evaluation with either telemedicine or DOD assessment in several weeks with Dr. Martinique.  If she  continues to have increased symptomatology I would recommend further titration of of amlodipine but I will not do this presently.  Her blood pressure presently is controlled on amlodipine 5 mg, isosorbide 120 mg, losartan 100 mg, and her metoprolol 37.5 twice a day.  Her GERD is controlled with omeprazole.  She continues to be on atorvastatin and omega-3 fatty acid for hyperlipidemia with target LDL less than 70.   Medication Adjustments/Labs and Tests Ordered: Current medicines are reviewed at length with the patient today.  Concerns regarding medicines are outlined above.  Medication changes, Labs and Tests ordered today are listed in the Patient Instructions below. Patient Instructions  Medication Instructions:  Increase Renexa to 1000 mg twice daily.  Increase Metoprolol to 50 in the morning, and 37.5 in the evening.   If you need a refill on your cardiac medications before your next appointment, please call your pharmacy.   Follow-Up: At [facility], you and your health needs are our priority.  As part of our continuing mission to provide you with exceptional heart care, we have created designated Provider Care Teams.  These Care Teams include your primary Cardiologist (physician) and Advanced Practice Providers (APPs -  Physician Assistants and Nurse Practitioners) who all work together to provide you with the care you need, when you need it. You will need a follow up appointment in 2 weeks. You may see Dr.Jakeline Dave or one of the following Advanced Practice Providers on your designated Care Team: Almyra Deforest, PA-C  Fabian Sharp, [place]       Signed, Shelva Majestic, MD  06/29/2018 6:40 PM    [facility] [address], [place], [place], [place]  [address] Phone: [phone]

## 2018-06-29 NOTE — Patient Instructions (Addendum)
Medication Instructions:  Increase Renexa to 1000 mg twice daily.  Increase Metoprolol to 50 in the morning, and 37.5 in the evening.   If you need a refill on your cardiac medications before your next appointment, please call your pharmacy.   Follow-Up: At [facility], you and your health needs are our priority.  As part of our continuing mission to provide you with exceptional heart care, we have created designated Provider Care Teams.  These Care Teams include your primary Cardiologist (physician) and Advanced Practice Providers (APPs -  Physician Assistants and Nurse Practitioners) who all work together to provide you with the care you need, when you need it. You will need a follow up appointment in 2 weeks. You may see Dr.Kelly or one of the following Advanced Practice Providers on your designated Care Team: Almyra Deforest, [place] . Fabian Sharp, PA-C

## 2018-07-02 ENCOUNTER — Encounter (HOSPITAL_COMMUNITY): Payer: Self-pay | Admitting: Emergency Medicine

## 2018-07-02 ENCOUNTER — Other Ambulatory Visit: Payer: Self-pay

## 2018-07-02 ENCOUNTER — Inpatient Hospital Stay (HOSPITAL_COMMUNITY)
Admission: EM | Admit: 2018-07-02 | Discharge: 2018-07-07 | DRG: 812 | Disposition: A | Discharge status: Home Health | Payer: Medicare Other | Attending: Internal Medicine | Admitting: Internal Medicine

## 2018-07-02 DIAGNOSIS — Z20828 Contact with and (suspected) exposure to other viral communicable diseases: Secondary | ICD-10-CM | POA: Diagnosis not present

## 2018-07-02 DIAGNOSIS — I2510 Atherosclerotic heart disease of native coronary artery without angina pectoris: Secondary | ICD-10-CM | POA: Diagnosis present

## 2018-07-02 DIAGNOSIS — R079 Chest pain, unspecified: Secondary | ICD-10-CM | POA: Diagnosis not present

## 2018-07-02 DIAGNOSIS — I340 Nonrheumatic mitral (valve) insufficiency: Secondary | ICD-10-CM | POA: Diagnosis present

## 2018-07-02 DIAGNOSIS — I951 Orthostatic hypotension: Secondary | ICD-10-CM | POA: Diagnosis not present

## 2018-07-02 DIAGNOSIS — I4891 Unspecified atrial fibrillation: Secondary | ICD-10-CM | POA: Diagnosis present

## 2018-07-02 DIAGNOSIS — Z882 Allergy status to sulfonamides status: Secondary | ICD-10-CM

## 2018-07-02 DIAGNOSIS — Z79899 Other long term (current) drug therapy: Secondary | ICD-10-CM

## 2018-07-02 DIAGNOSIS — D649 Anemia, unspecified: Secondary | ICD-10-CM | POA: Diagnosis not present

## 2018-07-02 DIAGNOSIS — R609 Edema, unspecified: Secondary | ICD-10-CM

## 2018-07-02 DIAGNOSIS — J811 Chronic pulmonary edema: Secondary | ICD-10-CM | POA: Diagnosis not present

## 2018-07-02 DIAGNOSIS — N390 Urinary tract infection, site not specified: Secondary | ICD-10-CM | POA: Diagnosis present

## 2018-07-02 DIAGNOSIS — R9431 Abnormal electrocardiogram [ECG] [EKG]: Secondary | ICD-10-CM | POA: Diagnosis not present

## 2018-07-02 DIAGNOSIS — I25119 Atherosclerotic heart disease of native coronary artery with unspecified angina pectoris: Secondary | ICD-10-CM | POA: Diagnosis present

## 2018-07-02 DIAGNOSIS — D62 Acute posthemorrhagic anemia: Secondary | ICD-10-CM | POA: Diagnosis not present

## 2018-07-02 DIAGNOSIS — R0602 Shortness of breath: Secondary | ICD-10-CM | POA: Diagnosis not present

## 2018-07-02 DIAGNOSIS — Z951 Presence of aortocoronary bypass graft: Secondary | ICD-10-CM

## 2018-07-02 DIAGNOSIS — I214 Non-ST elevation (NSTEMI) myocardial infarction: Secondary | ICD-10-CM

## 2018-07-02 DIAGNOSIS — I480 Paroxysmal atrial fibrillation: Secondary | ICD-10-CM | POA: Diagnosis present

## 2018-07-02 DIAGNOSIS — I25110 Atherosclerotic heart disease of native coronary artery with unstable angina pectoris: Secondary | ICD-10-CM

## 2018-07-02 DIAGNOSIS — Z91048 Other nonmedicinal substance allergy status: Secondary | ICD-10-CM

## 2018-07-02 DIAGNOSIS — J810 Acute pulmonary edema: Secondary | ICD-10-CM | POA: Diagnosis not present

## 2018-07-02 DIAGNOSIS — Z7901 Long term (current) use of anticoagulants: Secondary | ICD-10-CM

## 2018-07-02 DIAGNOSIS — K922 Gastrointestinal hemorrhage, unspecified: Secondary | ICD-10-CM | POA: Diagnosis present

## 2018-07-02 DIAGNOSIS — Z8249 Family history of ischemic heart disease and other diseases of the circulatory system: Secondary | ICD-10-CM

## 2018-07-02 DIAGNOSIS — R110 Nausea: Secondary | ICD-10-CM | POA: Diagnosis not present

## 2018-07-02 DIAGNOSIS — Z905 Acquired absence of kidney: Secondary | ICD-10-CM

## 2018-07-02 DIAGNOSIS — Z87440 Personal history of urinary (tract) infections: Secondary | ICD-10-CM

## 2018-07-02 DIAGNOSIS — I10 Essential (primary) hypertension: Secondary | ICD-10-CM | POA: Diagnosis present

## 2018-07-02 DIAGNOSIS — R0902 Hypoxemia: Secondary | ICD-10-CM | POA: Diagnosis not present

## 2018-07-02 DIAGNOSIS — I209 Angina pectoris, unspecified: Secondary | ICD-10-CM | POA: Diagnosis present

## 2018-07-02 DIAGNOSIS — Z885 Allergy status to narcotic agent status: Secondary | ICD-10-CM

## 2018-07-02 DIAGNOSIS — R12 Heartburn: Secondary | ICD-10-CM | POA: Diagnosis present

## 2018-07-02 DIAGNOSIS — Z888 Allergy status to other drugs, medicaments and biological substances status: Secondary | ICD-10-CM

## 2018-07-02 DIAGNOSIS — E785 Hyperlipidemia, unspecified: Secondary | ICD-10-CM | POA: Diagnosis present

## 2018-07-02 DIAGNOSIS — L8990 Pressure ulcer of unspecified site, unspecified stage: Secondary | ICD-10-CM

## 2018-07-02 DIAGNOSIS — R040 Epistaxis: Secondary | ICD-10-CM | POA: Diagnosis present

## 2018-07-02 NOTE — ED Triage Notes (Signed)
  Patient BIB EMS for weakness and nausea that has been going on since earlier in the day.  Patient was recently seen for chest pain and told it was angina in addition to increasing her metoprolol dose.  Patient states when she gets up to move around she feels like she is going to pass out.  Patient had some nausea earlier with no emesis.  Patient is A&O x4.  HR 62. BP 117/59

## 2018-07-03 ENCOUNTER — Emergency Department (HOSPITAL_COMMUNITY): Payer: Medicare Other

## 2018-07-03 DIAGNOSIS — R040 Epistaxis: Secondary | ICD-10-CM | POA: Diagnosis present

## 2018-07-03 DIAGNOSIS — J90 Pleural effusion, not elsewhere classified: Secondary | ICD-10-CM | POA: Diagnosis not present

## 2018-07-03 DIAGNOSIS — I25119 Atherosclerotic heart disease of native coronary artery with unspecified angina pectoris: Secondary | ICD-10-CM | POA: Diagnosis present

## 2018-07-03 DIAGNOSIS — D62 Acute posthemorrhagic anemia: Secondary | ICD-10-CM | POA: Diagnosis present

## 2018-07-03 DIAGNOSIS — Z20828 Contact with and (suspected) exposure to other viral communicable diseases: Secondary | ICD-10-CM | POA: Diagnosis present

## 2018-07-03 DIAGNOSIS — Z888 Allergy status to other drugs, medicaments and biological substances status: Secondary | ICD-10-CM | POA: Diagnosis not present

## 2018-07-03 DIAGNOSIS — I340 Nonrheumatic mitral (valve) insufficiency: Secondary | ICD-10-CM | POA: Diagnosis present

## 2018-07-03 DIAGNOSIS — D649 Anemia, unspecified: Secondary | ICD-10-CM | POA: Diagnosis not present

## 2018-07-03 DIAGNOSIS — Z7901 Long term (current) use of anticoagulants: Secondary | ICD-10-CM | POA: Diagnosis not present

## 2018-07-03 DIAGNOSIS — I951 Orthostatic hypotension: Secondary | ICD-10-CM | POA: Diagnosis not present

## 2018-07-03 DIAGNOSIS — Z87440 Personal history of urinary (tract) infections: Secondary | ICD-10-CM | POA: Diagnosis not present

## 2018-07-03 DIAGNOSIS — Z79899 Other long term (current) drug therapy: Secondary | ICD-10-CM | POA: Diagnosis not present

## 2018-07-03 DIAGNOSIS — I480 Paroxysmal atrial fibrillation: Secondary | ICD-10-CM | POA: Diagnosis present

## 2018-07-03 DIAGNOSIS — R0602 Shortness of breath: Secondary | ICD-10-CM | POA: Diagnosis not present

## 2018-07-03 DIAGNOSIS — I214 Non-ST elevation (NSTEMI) myocardial infarction: Secondary | ICD-10-CM | POA: Diagnosis not present

## 2018-07-03 DIAGNOSIS — Z885 Allergy status to narcotic agent status: Secondary | ICD-10-CM | POA: Diagnosis not present

## 2018-07-03 DIAGNOSIS — Z951 Presence of aortocoronary bypass graft: Secondary | ICD-10-CM | POA: Diagnosis not present

## 2018-07-03 DIAGNOSIS — K922 Gastrointestinal hemorrhage, unspecified: Secondary | ICD-10-CM | POA: Diagnosis present

## 2018-07-03 DIAGNOSIS — Z8249 Family history of ischemic heart disease and other diseases of the circulatory system: Secondary | ICD-10-CM | POA: Diagnosis not present

## 2018-07-03 DIAGNOSIS — R05 Cough: Secondary | ICD-10-CM | POA: Diagnosis not present

## 2018-07-03 DIAGNOSIS — I10 Essential (primary) hypertension: Secondary | ICD-10-CM | POA: Diagnosis present

## 2018-07-03 DIAGNOSIS — J811 Chronic pulmonary edema: Secondary | ICD-10-CM | POA: Diagnosis present

## 2018-07-03 DIAGNOSIS — Z905 Acquired absence of kidney: Secondary | ICD-10-CM | POA: Diagnosis not present

## 2018-07-03 DIAGNOSIS — J810 Acute pulmonary edema: Secondary | ICD-10-CM | POA: Diagnosis not present

## 2018-07-03 DIAGNOSIS — Z91048 Other nonmedicinal substance allergy status: Secondary | ICD-10-CM | POA: Diagnosis not present

## 2018-07-03 DIAGNOSIS — R12 Heartburn: Secondary | ICD-10-CM | POA: Diagnosis present

## 2018-07-03 DIAGNOSIS — Z882 Allergy status to sulfonamides status: Secondary | ICD-10-CM | POA: Diagnosis not present

## 2018-07-03 DIAGNOSIS — I25709 Atherosclerosis of coronary artery bypass graft(s), unspecified, with unspecified angina pectoris: Secondary | ICD-10-CM | POA: Diagnosis not present

## 2018-07-03 DIAGNOSIS — R079 Chest pain, unspecified: Secondary | ICD-10-CM | POA: Diagnosis not present

## 2018-07-03 DIAGNOSIS — E785 Hyperlipidemia, unspecified: Secondary | ICD-10-CM | POA: Diagnosis present

## 2018-07-03 DIAGNOSIS — I209 Angina pectoris, unspecified: Secondary | ICD-10-CM | POA: Diagnosis not present

## 2018-07-03 LAB — URINALYSIS, ROUTINE W REFLEX MICROSCOPIC
Bilirubin Urine: NEGATIVE
Glucose, UA: NEGATIVE mg/dL
Ketones, ur: NEGATIVE mg/dL
Nitrite: NEGATIVE
Protein, ur: NEGATIVE mg/dL
Specific Gravity, Urine: 1.006 (ref 1.005–1.030)
pH: 5 (ref 5.0–8.0)

## 2018-07-03 LAB — CBC
HCT: 21 % — ABNORMAL LOW (ref 36.0–46.0)
Hemoglobin: 6.6 g/dL — CL (ref 12.0–15.0)
MCH: 29.6 pg (ref 26.0–34.0)
MCHC: 31.4 g/dL (ref 30.0–36.0)
MCV: 94.2 fL (ref 80.0–100.0)
Platelets: 124 10*3/uL — ABNORMAL LOW (ref 150–400)
RBC: 2.23 MIL/uL — ABNORMAL LOW (ref 3.87–5.11)
RDW: 14.9 % (ref 11.5–15.5)
WBC: 9.1 10*3/uL (ref 4.0–10.5)
nRBC: 0.3 % — ABNORMAL HIGH (ref 0.0–0.2)

## 2018-07-03 LAB — COMPREHENSIVE METABOLIC PANEL
ALT: 19 U/L (ref 0–44)
AST: 32 U/L (ref 15–41)
Albumin: 3 g/dL — ABNORMAL LOW (ref 3.5–5.0)
Alkaline Phosphatase: 63 U/L (ref 38–126)
Anion gap: 10 (ref 5–15)
BUN: 11 mg/dL (ref 8–23)
CO2: 20 mmol/L — ABNORMAL LOW (ref 22–32)
Calcium: 8.6 mg/dL — ABNORMAL LOW (ref 8.9–10.3)
Chloride: 97 mmol/L — ABNORMAL LOW (ref 98–111)
Creatinine, Ser: 0.83 mg/dL (ref 0.44–1.00)
GFR calc Af Amer: >60 mL/min (ref >60)
GFR calc non Af Amer: >60 mL/min (ref >60)
Glucose, Bld: 125 mg/dL — ABNORMAL HIGH (ref 70–99)
Potassium: 3.9 mmol/L (ref 3.5–5.1)
Sodium: 127 mmol/L — ABNORMAL LOW (ref 135–145)
Total Bilirubin: 1 mg/dL (ref 0.3–1.2)
Total Protein: 6.8 g/dL (ref 6.5–8.1)

## 2018-07-03 LAB — BRAIN NATRIURETIC PEPTIDE: B Natriuretic Peptide: 1863.2 pg/mL — ABNORMAL HIGH (ref 0.0–100.0)

## 2018-07-03 LAB — FOLATE: Folate: 13.7 ng/mL (ref >5.9)

## 2018-07-03 LAB — CBG MONITORING, ED: Glucose-Capillary: 127 mg/dL — ABNORMAL HIGH (ref 70–99)

## 2018-07-03 LAB — IRON AND TIBC
Iron: 15 ug/dL — ABNORMAL LOW (ref 28–170)
Saturation Ratios: 3 % — ABNORMAL LOW (ref 10.4–31.8)
TIBC: 504 ug/dL — ABNORMAL HIGH (ref 250–450)
UIBC: 489 ug/dL

## 2018-07-03 LAB — HEMOGLOBIN AND HEMATOCRIT, BLOOD
HCT: 27 % — ABNORMAL LOW (ref 36.0–46.0)
HCT: 27.2 % — ABNORMAL LOW (ref 36.0–46.0)
Hemoglobin: 8.7 g/dL — ABNORMAL LOW (ref 12.0–15.0)
Hemoglobin: 8.9 g/dL — ABNORMAL LOW (ref 12.0–15.0)

## 2018-07-03 LAB — PREPARE RBC (CROSSMATCH)

## 2018-07-03 LAB — SARS CORONAVIRUS 2 BY RT PCR (HOSPITAL ORDER, PERFORMED IN ~~LOC~~ HOSPITAL LAB): SARS Coronavirus 2: NEGATIVE

## 2018-07-03 LAB — RETICULOCYTES
Immature Retic Fract: 29.1 % — ABNORMAL HIGH (ref 2.3–15.9)
RBC.: 2.26 MIL/uL — ABNORMAL LOW (ref 3.87–5.11)
Retic Count, Absolute: 97 10*3/uL (ref 19.0–186.0)
Retic Ct Pct: 4.3 % — ABNORMAL HIGH (ref 0.4–3.1)

## 2018-07-03 LAB — TROPONIN I
Troponin I: 0.26 ng/mL (ref <0.03)
Troponin I: 0.28 ng/mL (ref <0.03)
Troponin I: 0.21 ng/mL (ref <0.03)
Troponin I: 0.21 ng/mL (ref <0.03)

## 2018-07-03 LAB — ABO/RH: ABO/RH(D): O POS

## 2018-07-03 LAB — VITAMIN B12: Vitamin B-12: 409 pg/mL (ref 180–914)

## 2018-07-03 LAB — FERRITIN: Ferritin: 16 ng/mL (ref 11–307)

## 2018-07-03 MED ORDER — METOPROLOL TARTRATE 25 MG PO TABS
37.5000 mg | ORAL_TABLET | Freq: Two times a day (BID) | ORAL | Status: DC
  Administered 2018-07-03: 23:00:00 37.5 mg via ORAL
  Administered 2018-07-03 – 2018-07-04 (×2): 50 mg via ORAL
  Administered 2018-07-04: 37.5 mg via ORAL
  Administered 2018-07-05: 12:00:00 50 mg via ORAL
  Administered 2018-07-05: 37.5 mg via ORAL
  Filled 2018-07-03 (×6): qty 2

## 2018-07-03 MED ORDER — RANOLAZINE ER 500 MG PO TB12
1000.0000 mg | ORAL_TABLET | Freq: Two times a day (BID) | ORAL | Status: DC
  Administered 2018-07-03 – 2018-07-07 (×9): 1000 mg via ORAL
  Filled 2018-07-03 (×10): qty 2

## 2018-07-03 MED ORDER — ACETAMINOPHEN 325 MG PO TABS
650.0000 mg | ORAL_TABLET | Freq: Four times a day (QID) | ORAL | Status: DC | PRN
  Administered 2018-07-05 – 2018-07-07 (×5): 650 mg via ORAL
  Filled 2018-07-03 (×5): qty 2

## 2018-07-03 MED ORDER — ONDANSETRON HCL 4 MG PO TABS: 4.0000 mg | ORAL_TABLET | Freq: Four times a day (QID) | ORAL | Status: DC | PRN

## 2018-07-03 MED ORDER — FUROSEMIDE 10 MG/ML IJ SOLN
40.0000 mg | Freq: Once | INTRAMUSCULAR | Status: AC
  Administered 2018-07-03: 40 mg via INTRAVENOUS
  Filled 2018-07-03: qty 4

## 2018-07-03 MED ORDER — SODIUM CHLORIDE 0.9% IV SOLUTION: Freq: Once | INTRAVENOUS | Status: DC

## 2018-07-03 MED ORDER — PANTOPRAZOLE SODIUM 40 MG PO TBEC
40.0000 mg | DELAYED_RELEASE_TABLET | Freq: Every day | ORAL | Status: DC
  Administered 2018-07-03 – 2018-07-07 (×5): 40 mg via ORAL
  Filled 2018-07-03 (×5): qty 1

## 2018-07-03 MED ORDER — ASPIRIN 81 MG PO CHEW
324.0000 mg | CHEWABLE_TABLET | Freq: Once | ORAL | Status: AC
  Administered 2018-07-03: 324 mg via ORAL
  Filled 2018-07-03: qty 4

## 2018-07-03 MED ORDER — ISOSORBIDE MONONITRATE ER 60 MG PO TB24
120.0000 mg | ORAL_TABLET | Freq: Every day | ORAL | Status: DC
  Administered 2018-07-03 – 2018-07-07 (×5): 120 mg via ORAL
  Filled 2018-07-03 (×5): qty 2

## 2018-07-03 MED ORDER — ATORVASTATIN CALCIUM 40 MG PO TABS
40.0000 mg | ORAL_TABLET | Freq: Every day | ORAL | Status: DC
  Administered 2018-07-03 – 2018-07-06 (×4): 40 mg via ORAL
  Filled 2018-07-03 (×4): qty 1

## 2018-07-03 MED ORDER — ACETAMINOPHEN 650 MG RE SUPP: 650.0000 mg | Freq: Four times a day (QID) | RECTAL | Status: DC | PRN

## 2018-07-03 MED ORDER — ONDANSETRON HCL 4 MG/2ML IJ SOLN
4.0000 mg | Freq: Four times a day (QID) | INTRAMUSCULAR | Status: DC | PRN
  Administered 2018-07-03 – 2018-07-06 (×3): 4 mg via INTRAVENOUS
  Filled 2018-07-03 (×3): qty 2

## 2018-07-03 MED ORDER — TRIMETHOPRIM 100 MG PO TABS
100.0000 mg | ORAL_TABLET | Freq: Every day | ORAL | Status: DC
  Administered 2018-07-03 – 2018-07-07 (×5): 100 mg via ORAL
  Filled 2018-07-03 (×5): qty 1

## 2018-07-03 NOTE — Consult Note (Signed)
Cardiology Consultation:   Patient ID: [name] MRN: [id number]; DOB: 1934/06/30  Admit date: 07/02/2018 Date of Consult: 07/03/2018  Primary Care Provider: Leanna Battles, MD Primary Cardiologist: Dr. Claiborne Billings Primary Electrophysiologist:  None    Patient Profile:   [name] is a 84 y.o. female with a hx of CAD s/p CABG, chronic chest pain, HLD, A. Fib on eliquis and HTN who is being seen today for the evaluation of chest pain.  History of Present Illness:   [name] was recently seen by Dr. Claiborne Billings due to worsening chest pain with exertion. She has had worsening chest pain with exertion (walking) that resolve with rest. She has not had any rest symptoms. At her clinic visit, her metoprolol was increased to help with her chest pain. She is also on ranexa and imdur given her chronic angina. She presented to the ER today due to worsening weakness and nausea as well as symptoms of lightheadedness. Notably, her lab work shows a hemoglobin of 6.6 down from >12 in the past. Cardiology is consulted for advice on anticoagulation in the setting of low hemoglobin.   Past Medical History:  Diagnosis Date  . Anxiety   . Atrial fibrillation ([place])   . Coronary artery disease    STATUS POST CABG  . DA (degenerative arthritis)   . Hearing loss   . Heartburn   . Hyperlipidemia   . Hypertension   . Ovarian cyst    h/o complex right ovarian cyst/followed yearly  . Skin cancer     Past Surgical History:  Procedure Laterality Date  . CARDIAC CATHETERIZATION  04/16/2007   NORMAL LEFT VENTRICULAR SIZE AND CONTRACTILITY WITH NORMAL SYSTOLIC FUNCTION. EF 60%. THERE IS MILD TO MODERATE MITRAL INSUFFICIENCY  . CHOLECYSTECTOMY    . CORONARY ARTERY BYPASS GRAFT  1986   X3, LIMA GRAFT TO THE LAD. SHE HAS POOR TARGETS  . HYSTEROSCOPY  8/04   polyp resection  . NEPHRECTOMY     LEFT  . SKIN CANCER EXCISION    . TONSILLECTOMY    . TUBAL LIGATION       Home Medications:  Prior to  Admission medications   Medication Sig Start Date End Date Taking? Authorizing Provider  amLODipine (NORVASC) 5 MG tablet Take 1 tablet (5 mg total) by mouth daily. KEEP OV. 05/18/18   Martinique, Peter M, MD  apixaban (ELIQUIS) 5 MG TABS tablet Take 1 tablet (5 mg total) by mouth 2 (two) times daily. 04/16/18   Martinique, Peter M, MD  atorvastatin (LIPITOR) 40 MG tablet TAKE 1 TABLET BY MOUTH AT BEDTIME 05/10/18   Martinique, Peter M, MD  isosorbide mononitrate (IMDUR) 60 MG 24 hr tablet Take 2 tablets (120 mg total) by mouth daily. 06/25/18   Minus Breeding, MD  losartan (COZAAR) 100 MG tablet Take 1 tablet (100 mg total) by mouth daily. 04/29/12 06/11/13  Martinique, Peter M, MD  metoprolol tartrate (LOPRESSOR) 25 MG tablet Take 2 tablets(50 mg) in the morning, and 1.5 tablet (37.5 mg) in the evening 06/29/18   Troy Sine, MD  nitroGLYCERIN (NITROSTAT) 0.4 MG SL tablet Place 1 tablet (0.4 mg total) under the tongue every 5 (five) minutes as needed for chest pain. 06/21/18 06/21/19  Martinique, Peter M, MD  NITROSTAT 0.4 MG SL tablet Reported on 08/09/2015 05/18/15   [provider]  nystatin cream (MYCOSTATIN) APPLY TO AFFECTED AREA TWICE DAILY FOR 7 DAYS 09/07/17   Megan Salon, MD  Omega-3 Fatty Acids (FISH OIL)  1000 MG CAPS Take by mouth daily.      [provider]  omeprazole (PRILOSEC) 20 MG capsule Take 20 mg by mouth daily.    [provider]  ranolazine (RANEXA) 1000 MG SR tablet Take 1 tablet (1,000 mg total) by mouth 2 (two) times daily. 06/29/18   Troy Sine, MD  trimethoprim (TRIMPEX) 100 MG tablet Take 1 tablet (100 mg total) by mouth daily. 03/30/15   Megan Salon, MD    Inpatient Medications: Scheduled Meds: . sodium chloride   Intravenous Once  . furosemide  40 mg Intravenous Once   Continuous Infusions:  PRN Meds:   Allergies:    Allergies  Allergen Reactions  . Demerol Nausea And Vomiting  . Niacin   . Niaspan [Niacin Er] Other (See Comments)    Facial  flushing  . Sulfa Antibiotics Other (See Comments)    Causes weakness  . Tape Itching and Other (See Comments)    Burns, too    Social History:   Social History   Socioeconomic History  . Marital status: Married    Spouse name: Not on file  . Number of children: 1  . Years of education: Not on file  . Highest education level: Not on file  Occupational History  . 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 Financial resource strain: Not on file  . Food insecurity:    Worry: Not on file    Inability: Not on file  . Transportation needs:    Medical: Not on file    Non-medical: Not on file  Tobacco Use  . Smoking status: Never Smoker  . Smokeless tobacco: Never Used  Substance and Sexual Activity  . Alcohol use: No  . Drug use: No  . Sexual activity: Yes    Birth control/protection: Surgical    Comment: BTL  Lifestyle  . Physical activity:    Days per week: Not on file    Minutes per session: Not on file  . Stress: Not on file  Relationships  . Social connections:    Talks on phone: Not on file    Gets together: Not on file    Attends religious service: Not on file    Active member of club or organization: Not on file    Attends meetings of clubs or organizations: Not on file    Relationship status: Not on file  . Intimate partner violence:    Fear of current or ex partner: Not on file    Emotionally abused: Not on file    Physically abused: Not on file    Forced sexual activity: Not on file  Other Topics Concern  . Not on file  Social History Narrative  . Not on file    Family History:   Family History  Problem Relation Age of Onset  . Hypertension Father   . Breast cancer Sister      ROS:  Please see the history of present illness.  All other ROS reviewed and negative.     Physical Exam/Data:   Vitals:   07/02/18 2346 07/03/18 0015 07/03/18 0115 07/03/18 0130  BP:  (!) 118/58 127/68 127/60  Pulse:  66 65 64   Resp:  (!) 23 18 (!) 26  Temp:      TempSrc:      SpO2:  98% 100% 97%  Weight: 60.6 kg     Height: 5\' 1"  (1.549 m)  No intake or output data in the 24 hours ending 07/03/18 0203 Last 3 Weights 07/02/2018 06/29/2018 11/23/2017  Weight (lbs) 133 lb 9.6 oz 133 lb 9.6 oz 133 lb 9.6 oz  Weight (kg) 60.6 kg 60.601 kg 60.601 kg     Body mass index is 25.24 kg/m.  Exam deferred pending COVID testing.  EKG:  The EKG was personally reviewed and demonstrates:  Sinus rhythm with inferolateral ST depression and T wave inverison  Relevant CV Studies: Echo 2013 - Left ventricle: The cavity size was normal. There was mild focal basal hypertrophy of the septum. Systolic function was mildly reduced. The estimated ejection fraction was in the range of 45% to 50%. There is hypokinesis of the entireinferolateral myocardium. Doppler parameters are consistent with abnormal left ventricular relaxation (grade 1 diastolic dysfunction). - Mitral valve: Mild regurgitation. - Left atrium: The atrium was mildly dilated.  Cath 2009 Patent LIMA-LAD, all SVGs occluded  Laboratory Data:  Chemistry Recent Labs  Lab 07/03/18 0015  NA 127*  K 3.9  CL 97*  CO2 20*  GLUCOSE 125*  BUN 11  CREATININE 0.83  CALCIUM 8.6*  GFRNONAA >60  GFRAA >60  ANIONGAP 10    Recent Labs  Lab 07/03/18 0015  PROT 6.8  ALBUMIN 3.0*  AST 32  ALT 19  ALKPHOS 63  BILITOT 1.0   Hematology Recent Labs  Lab 07/03/18 0015  WBC 9.1  RBC 2.23*  HGB 6.6*  HCT 21.0*  MCV 94.2  MCH 29.6  MCHC 31.4  RDW 14.9  PLT 124*   Cardiac Enzymes Recent Labs  Lab 07/03/18 0015  TROPONINI 0.26*   No results for input(s): TROPIPOC in the last 168 hours.  BNPNo results for input(s): BNP, PROBNP in the last 168 hours.  DDimer No results for input(s): DDIMER in the last 168 hours.  Radiology/Studies:  Dg Chest 2 View  Result Date: 07/03/2018 CLINICAL DATA:  Chest pain, shortness of breath EXAM: CHEST - 2  VIEW COMPARISON:  None. FINDINGS: Prior median sternotomy and CABG. Low lung volumes. Mild vascular congestion. Bibasilar opacities with small effusions. No acute bony abnormality. IMPRESSION: Low lung volumes with bibasilar opacities, left greater than right, atelectasis versus infiltrates. Small bilateral effusions. Mild vascular congestion. Electronically Signed   By: [name] M.D.   On: 07/03/2018 01:02    Assessment and Plan:   VALENCIA KASSA is a 84 y.o. female with a hx of CAD s/p CABG, chronic chest pain, HLD, A. Fib on eliquis and HTN who is being seen today for the evaluation of chest pain. Interestingly, her hemoglobin is significantly decreased compared to prior. It is unclear how acute this drop is but it does seem that her symptoms are significantly worse today suggesting some degree of acuity. This is certainly contributing to her chronic chest pain and creating more demand as seen by the worsening ECG changes and mildly elevated troponin. It is possible that the increased beta blocker is blocking her tachycardic response that we would expect with such a low hemoglobin. She warrants work-up for her low hemoglobin as well as transfusion to increase her coronary oxygen carrying capacity. As her anticoagulation is just for A. Fib, it is reasonable to hold that at this time while she is evaluated.  - Ok to hold anticoagulation - Prefer to continue aspirin if possible - Could other cardiac medications (including metoprolol, ranexa and imdur) - Trend troponins  - Transfuse with goal Hg>8 - Cardiology will continue to follow  For questions or updates, please contact [facility] Please consult [email or web address] for contact info under     Signed, Princella Pellegrini, MD  07/03/2018 2:03 AM

## 2018-07-03 NOTE — ED Notes (Signed)
Patient transported to X-ray 

## 2018-07-03 NOTE — ED Notes (Signed)
ED TO INPATIENT HANDOFF REPORT  ED Nurse Name and Phone #:   Lenice Pressman [phone]  S Name/Age/Gender [name] 84 y.o. female Room/Bed: 021C/021C  Code Status   Code Status: Full Code  Home/SNF/Other Home Patient oriented to: self, place, time and situation Is this baseline? Yes   Triage Complete: Triage complete  Chief Complaint Weakness/Nausea  Triage Note  Patient BIB EMS for weakness and nausea that has been going on since earlier in the day.  Patient was recently seen for chest pain and told it was angina in addition to increasing her metoprolol dose.  Patient states when she gets up to move around she feels like she is going to pass out.  Patient had some nausea earlier with no emesis.  Patient is A&O x4.  HR 62. BP 117/59   Allergies Allergies  Allergen Reactions  . Demerol Nausea And Vomiting  . Niacin   . Niaspan [Niacin Er] Other (See Comments)    Facial flushing  . Sulfa Antibiotics Other (See Comments)    Causes weakness  . Tape Itching and Other (See Comments)    Burns, too    Level of Care/Admitting Diagnosis ED Disposition    ED Disposition Condition [place]: [place] [100100]  Level of Care: Progressive [102]  Covid Evaluation: N/A  Diagnosis: Symptomatic anemia [5176160]  Admitting Physician: Doreatha Massed  Attending Physician: Etta Quill [phone]  Estimated length of stay: past midnight tomorrow  Certification:: I certify this patient will need inpatient services for at least 2 midnights  PT Class (Do Not Modify): Inpatient [101]  PT Acc Code (Do Not Modify): Private [1]       B Medical/Surgery History Past Medical History:  Diagnosis Date  . Anxiety   . Atrial fibrillation ([place])   . Coronary artery disease    STATUS POST CABG  . DA (degenerative arthritis)   . Hearing loss   . Heartburn   . Hyperlipidemia   . Hypertension   . Ovarian cyst    h/o complex right ovarian  cyst/followed yearly  . Skin cancer    Past Surgical History:  Procedure Laterality Date  . CARDIAC CATHETERIZATION  04/16/2007   NORMAL LEFT VENTRICULAR SIZE AND CONTRACTILITY WITH NORMAL SYSTOLIC FUNCTION. EF 60%. THERE IS MILD TO MODERATE MITRAL INSUFFICIENCY  . CHOLECYSTECTOMY    . CORONARY ARTERY BYPASS GRAFT  1986   X3, LIMA GRAFT TO THE LAD. SHE HAS POOR TARGETS  . HYSTEROSCOPY  8/04   polyp resection  . NEPHRECTOMY     LEFT  . SKIN CANCER EXCISION    . TONSILLECTOMY    . TUBAL LIGATION       A IV Location/Drains/Wounds Patient Lines/Drains/Airways Status   Active Line/Drains/Airways    Name:   Placement date:   Placement time:   Site:   Days:   Peripheral IV 07/03/18 Left Antecubital   07/03/18    0010    Antecubital   less than 1          Intake/Output Last 24 hours No intake or output data in the 24 hours ending 07/03/18 0251  Labs/Imaging Results for orders placed or performed during the hospital encounter of 07/02/18 (from the past 48 hour(s))  CBG monitoring, ED     Status: Abnormal   Collection Time: 07/03/18 12:15 AM  Result Value Ref Range   Glucose-Capillary 127 (H) 70 - 99 mg/dL  Comprehensive metabolic panel  Status: Abnormal   Collection Time: 07/03/18 12:15 AM  Result Value Ref Range   Sodium 127 (L) 135 - 145 mmol/L   Potassium 3.9 3.5 - 5.1 mmol/L   Chloride 97 (L) 98 - 111 mmol/L   CO2 20 (L) 22 - 32 mmol/L   Glucose, Bld 125 (H) 70 - 99 mg/dL   BUN 11 8 - 23 mg/dL   Creatinine, Ser 0.83 0.44 - 1.00 mg/dL   Calcium 8.6 (L) 8.9 - 10.3 mg/dL   Total Protein 6.8 6.5 - 8.1 g/dL   Albumin 3.0 (L) 3.5 - 5.0 g/dL   AST 32 15 - 41 U/L   ALT 19 0 - 44 U/L   Alkaline Phosphatase 63 38 - 126 U/L   Total Bilirubin 1.0 0.3 - 1.2 mg/dL   GFR calc non Af Amer >60 >60 mL/min   GFR calc Af Amer >60 >60 mL/min   Anion gap 10 5 - 15    Comment: Performed at [facility], [place] [address] [place], [place] [address]  Troponin I - ONCE - STAT      Status: Abnormal   Collection Time: 07/03/18 12:15 AM  Result Value Ref Range   Troponin I 0.26 (HH) <0.03 ng/mL    Comment: CRITICAL RESULT CALLED TO, READ BACK BY AND VERIFIED WITH: A Bethena Midget [id number] [facility] Performed at [facility], [place] [address] [place], [place] [address]   CBC     Status: Abnormal   Collection Time: 07/03/18 12:15 AM  Result Value Ref Range   WBC 9.1 4.0 - 10.5 K/uL   RBC 2.23 (L) 3.87 - 5.11 MIL/uL   Hemoglobin 6.6 (LL) 12.0 - 15.0 g/dL    Comment: REPEATED TO VERIFY THIS CRITICAL RESULT HAS VERIFIED AND BEEN CALLED TO RN Neeva Trew BY MESSAN HOUEGNIFIO ON 05 02 2020 AT 0126, AND HAS BEEN READ BACK.     HCT 21.0 (L) 36.0 - 46.0 %   MCV 94.2 80.0 - 100.0 fL   MCH 29.6 26.0 - 34.0 pg   MCHC 31.4 30.0 - 36.0 g/dL   RDW 14.9 11.5 - 15.5 %   Platelets 124 (L) 150 - 400 K/uL   nRBC 0.3 (H) 0.0 - 0.2 %    Comment: Performed at [facility] [address], [place], [place] [address]  Reticulocytes     Status: Abnormal   Collection Time: 07/03/18  1:48 AM  Result Value Ref Range   Retic Ct Pct 4.3 (H) 0.4 - 3.1 %   RBC. 2.26 (L) 3.87 - 5.11 MIL/uL   Retic Count, Absolute 97.0 19.0 - 186.0 K/uL   Immature Retic Fract 29.1 (H) 2.3 - 15.9 %    Comment: Performed at [facility] [address], [place], [place] [address]  Type and screen     Status: None (Preliminary result)   Collection Time: 07/03/18  1:49 AM  Result Value Ref Range   ABO/RH(D) O POS    Antibody Screen NEG    Sample Expiration 07/06/2018    Unit Number [id number]    Blood Component Type RED CELLS,LR    Unit division 00    Status of Unit ALLOCATED    Transfusion Status OK TO TRANSFUSE    Crossmatch Result      Compatible Performed at [facility], [place] [address], [place], [place] [address]    Unit Number [id number]    Blood Component Type RBC LR PHER1  Unit division 00    Status of Unit ALLOCATED    Transfusion Status OK TO TRANSFUSE     Crossmatch Result Compatible   Prepare RBC     Status: None   Collection Time: 07/03/18  1:49 AM  Result Value Ref Range   Order Confirmation      ORDER PROCESSED BY BLOOD BANK Performed at [facility], [place] [address] [place], [place] [address]   ABO/Rh     Status: None   Collection Time: 07/03/18  1:49 AM  Result Value Ref Range   ABO/RH(D)      O POS Performed at [facility] [address], [place], [place] [address]    Dg Chest 2 View  Result Date: 07/03/2018 CLINICAL DATA:  Chest pain, shortness of breath EXAM: CHEST - 2 VIEW COMPARISON:  None. FINDINGS: Prior median sternotomy and CABG. Low lung volumes. Mild vascular congestion. Bibasilar opacities with small effusions. No acute bony abnormality. IMPRESSION: Low lung volumes with bibasilar opacities, left greater than right, atelectasis versus infiltrates. Small bilateral effusions. Mild vascular congestion. Electronically Signed   By: [name] M.D.   On: 07/03/2018 01:02    Pending Labs Unresulted Labs (From admission, onward)    Start     Ordered   07/03/18 0800  Hemoglobin and hematocrit, blood  Once-Timed,   R     07/03/18 0230   07/03/18 0231  Troponin I - Now Then Q6H  Now then every 6 hours,   R     07/03/18 0230   07/03/18 0140  Occult blood card to lab, stool  Once,   STAT     07/03/18 0139   07/03/18 0140  Vitamin B12  (Anemia Panel (PNL))  ONCE - STAT,   STAT     07/03/18 0139   07/03/18 0140  Folate  (Anemia Panel (PNL))  ONCE - STAT,   STAT     07/03/18 0139   07/03/18 0140  Iron and TIBC  (Anemia Panel (PNL))  ONCE - STAT,   STAT     07/03/18 0139   07/03/18 0140  Ferritin  (Anemia Panel (PNL))  ONCE - STAT,   STAT     07/03/18 0139   07/03/18 0120  SARS Coronavirus 2 [facility] order, Performed in [place] hospital lab)  (Novel Coronavirus, [facility] Order))  Once,   R     07/03/18 0120   07/03/18 0005  Brain natriuretic peptide  Once,   R     07/03/18 0004           Vitals/Pain Today's Vitals   07/02/18 2348 07/03/18 0015 07/03/18 0115 07/03/18 0130  BP:  (!) 118/58 127/68 127/60  Pulse:  66 65 64  Resp:  (!) 23 18 (!) 26  Temp:      TempSrc:      SpO2:  98% 100% 97%  Weight:      Height:      PainSc: 0-No pain       Isolation Precautions Droplet and Contact precautions  Medications Medications  0.9 %  sodium chloride infusion (Manually program via Guardrails IV Fluids) (has no administration in time range)  atorvastatin (LIPITOR) tablet 40 mg (has no administration in time range)  isosorbide mononitrate (IMDUR) 24 hr tablet 120 mg (has no administration in time range)  metoprolol tartrate (LOPRESSOR) tablet 37.5-50 mg (has no administration in time range)  ranolazine (RANEXA) 12 hr tablet 1,000 mg (has no administration in  time range)  trimethoprim (TRIMPEX) tablet 100 mg (has no administration in time range)  pantoprazole (PROTONIX) EC tablet 40 mg (has no administration in time range)  acetaminophen (TYLENOL) tablet 650 mg (has no administration in time range)    Or  acetaminophen (TYLENOL) suppository 650 mg (has no administration in time range)  ondansetron (ZOFRAN) tablet 4 mg (has no administration in time range)    Or  ondansetron (ZOFRAN) injection 4 mg (has no administration in time range)  aspirin chewable tablet 324 mg (324 mg Oral Given 07/03/18 0107)  furosemide (LASIX) injection 40 mg (40 mg Intravenous Given 07/03/18 0242)    Mobility walks with device Moderate fall risk   Focused Assessments Cardiac Assessment Handoff:    Lab Results  Component Value Date   CKTOTAL 42 04/16/2007   CKMB 3.2 04/16/2007   TROPONINI 0.26 ([place]) 07/03/2018   No results found for: DDIMER Does the Patient currently have chest pain? No     R Recommendations: See Admitting Provider Note  Report given to:   Additional Notes:

## 2018-07-03 NOTE — Progress Notes (Signed)
1 unit of blood transfused and completed. Blood bank notified RN that H&H needs to be drawn 2 hours post 1 unit and if hemoglobin is <7, the second unit can be given. Spoke with [name] in blood bank. Pt BP 137/121, HR 78, O2 96%.

## 2018-07-03 NOTE — H&P (Addendum)
History and Physical    CHRISTEE Ellis OAC:[id number] DOB: June 07, 1934 DOA: 07/02/2018  PCP: Leanna Battles, MD  Patient coming from: Home  I have personally briefly reviewed patient's old medical records in [place]  Chief Complaint: Chest pain, SOB  HPI: Marilyn SOCKWELL is a 84 y.o. female with medical history significant of CAD s/p CABG, chronic chest pain, all grafts of CABG except LIMA to LAD are down apparently, A.Fib on eliquis, HTN.  Patient presents to the ED with worsening CP with exertion for some time recently.  Saw Dr. Claiborne Billings for this a couple of days ago in Cards clinic.  At that visit metoprolol and Ranexa were increased to help with CP.  Today she presents to the ED with worsening generalized weakness, nausea, and lightheadedness.   ED Course: HGB 6.6, Trop 0.26.  CXR shows pulmonary vascular congestion.  Denies hematochezia, melena, hemoptysis.  After extensive questioning, patient reveals she has had frequent nose bleeds over the past couple of months.  Hemoccult ordered and pending.  EDP gave 40mg  lasix IV x1 and ordered 2u PRBC transfusion.  Cards consult in chart.   Review of Systems: As per HPI otherwise 10 point review of systems negative.   Past Medical History:  Diagnosis Date  . Anxiety   . Atrial fibrillation ([place])   . Coronary artery disease    STATUS POST CABG  . DA (degenerative arthritis)   . Hearing loss   . Heartburn   . Hyperlipidemia   . Hypertension   . Ovarian cyst    h/o complex right ovarian cyst/followed yearly  . Skin cancer     Past Surgical History:  Procedure Laterality Date  . CARDIAC CATHETERIZATION  04/16/2007   NORMAL LEFT VENTRICULAR SIZE AND CONTRACTILITY WITH NORMAL SYSTOLIC FUNCTION. EF 60%. THERE IS MILD TO MODERATE MITRAL INSUFFICIENCY  . CHOLECYSTECTOMY    . CORONARY ARTERY BYPASS GRAFT  1986   X3, LIMA GRAFT TO THE LAD. SHE HAS POOR TARGETS  . HYSTEROSCOPY  8/04   polyp resection  . NEPHRECTOMY      LEFT  . SKIN CANCER EXCISION    . TONSILLECTOMY    . TUBAL LIGATION       reports that she has never smoked. She has never used smokeless tobacco. She reports that she does not drink alcohol or use drugs.  Allergies  Allergen Reactions  . Demerol Nausea And Vomiting  . Niacin   . Niaspan [Niacin Er] Other (See Comments)    Facial flushing  . Sulfa Antibiotics Other (See Comments)    Causes weakness  . Tape Itching and Other (See Comments)    Burns, too    Family History  Problem Relation Age of Onset  . Hypertension Father   . Breast cancer Sister      Prior to Admission medications   Medication Sig Start Date End Date Taking? Authorizing Provider  amLODipine (NORVASC) 5 MG tablet Take 1 tablet (5 mg total) by mouth daily. KEEP OV. 05/18/18   [place], Peter M, MD  apixaban (ELIQUIS) 5 MG TABS tablet Take 1 tablet (5 mg total) by mouth 2 (two) times daily. 04/16/18   [place], Peter M, MD  atorvastatin (LIPITOR) 40 MG tablet TAKE 1 TABLET BY MOUTH AT BEDTIME 05/10/18   [place], Peter M, MD  isosorbide mononitrate (IMDUR) 60 MG 24 hr tablet Take 2 tablets (120 mg total) by mouth daily. 06/25/18   Minus Breeding, MD  losartan (COZAAR) 100 MG tablet Take  1 tablet (100 mg total) by mouth daily. 04/29/12 06/11/13  [place], Peter M, MD  metoprolol tartrate (LOPRESSOR) 25 MG tablet Take 2 tablets(50 mg) in the morning, and 1.5 tablet (37.5 mg) in the evening 06/29/18   Troy Sine, MD  nitroGLYCERIN (NITROSTAT) 0.4 MG SL tablet Place 1 tablet (0.4 mg total) under the tongue every 5 (five) minutes as needed for chest pain. 06/21/18 06/21/19  [place], Peter M, MD  NITROSTAT 0.4 MG SL tablet Reported on 08/09/2015 05/18/15   [provider]  nystatin cream (MYCOSTATIN) APPLY TO AFFECTED AREA TWICE DAILY FOR 7 DAYS 09/07/17   Megan Salon, MD  Omega-3 Fatty Acids (FISH OIL) 1000 MG CAPS Take by mouth daily.      [provider]  omeprazole (PRILOSEC) 20 MG capsule Take 20 mg by  mouth daily.    [provider]  ranolazine (RANEXA) 1000 MG SR tablet Take 1 tablet (1,000 mg total) by mouth 2 (two) times daily. 06/29/18   Troy Sine, MD  trimethoprim (TRIMPEX) 100 MG tablet Take 1 tablet (100 mg total) by mouth daily. 03/30/15   Megan Salon, MD    Physical Exam: Vitals:   07/03/18 0115 07/03/18 0130 07/03/18 0230 07/03/18 0245  BP: 127/68 127/60 106/67 (!) 109/51  Pulse: 65 64 65 69  Resp: 18 (!) 26 (!) 22 (!) 21  Temp:      TempSrc:      SpO2: 100% 97% 100% 96%  Weight:      Height:        Constitutional: NAD, calm, comfortable Eyes: PERRL, lids and conjunctivae normal ENMT: Mucous membranes are moist. Posterior pharynx clear of any exudate or lesions.Normal dentition.  Neck: normal, supple, no masses, no thyromegaly Respiratory: Diffuse rhonchi Cardiovascular: Regular rate and rhythm, no murmurs / rubs / gallops. No extremity edema. 2+ pedal pulses. No carotid bruits.  Abdomen: no tenderness, no masses palpated. No hepatosplenomegaly. Bowel sounds positive.  Musculoskeletal: no clubbing / cyanosis. No joint deformity upper and lower extremities. Good ROM, no contractures. Normal muscle tone.  Skin: no rashes, lesions, ulcers. No induration Neurologic: CN 2-12 grossly intact. Sensation intact, DTR normal. Strength 5/5 in all 4.  Psychiatric: Normal judgment and insight. Alert and oriented x 3. Normal mood.    Labs on Admission: I have personally reviewed following labs and imaging studies  CBC: Recent Labs  Lab 07/03/18 0015  WBC 9.1  HGB 6.6*  HCT 21.0*  MCV 94.2  PLT 481*   Basic Metabolic Panel: Recent Labs  Lab 07/03/18 0015  NA 127*  K 3.9  CL 97*  CO2 20*  GLUCOSE 125*  BUN 11  CREATININE 0.83  CALCIUM 8.6*   GFR: Estimated Creatinine Clearance: 44.4 mL/min (by C-G formula based on SCr of 0.83 mg/dL). Liver Function Tests: Recent Labs  Lab 07/03/18 0015  AST 32  ALT 19  ALKPHOS 63  BILITOT 1.0  PROT 6.8   ALBUMIN 3.0*   No results for input(s): LIPASE, AMYLASE in the last 168 hours. No results for input(s): AMMONIA in the last 168 hours. Coagulation Profile: No results for input(s): INR, PROTIME in the last 168 hours. Cardiac Enzymes: Recent Labs  Lab 07/03/18 0015  TROPONINI 0.26*   BNP (last 3 results) No results for input(s): PROBNP in the last 8760 hours. HbA1C: No results for input(s): HGBA1C in the last 72 hours. CBG: Recent Labs  Lab 07/03/18 0015  GLUCAP 127*   Lipid Profile: No results  for input(s): CHOL, HDL, LDLCALC, TRIG, CHOLHDL, LDLDIRECT in the last 72 hours. Thyroid Function Tests: No results for input(s): TSH, T4TOTAL, FREET4, T3FREE, THYROIDAB in the last 72 hours. Anemia Panel: Recent Labs    07/03/18 0148  RETICCTPCT 4.3*   Urine analysis:    Component Value Date/Time   BILIRUBINUR n 09/07/2017 1558   PROTEINUR Positive (A) 09/07/2017 1558   UROBILINOGEN 0.2 09/07/2017 1558   NITRITE n 09/07/2017 1558   LEUKOCYTESUR Moderate (2+) (A) 09/07/2017 1558    Radiological Exams on Admission: Dg Chest 2 View  Result Date: 07/03/2018 CLINICAL DATA:  Chest pain, shortness of breath EXAM: CHEST - 2 VIEW COMPARISON:  None. FINDINGS: Prior median sternotomy and CABG. Low lung volumes. Mild vascular congestion. Bibasilar opacities with small effusions. No acute bony abnormality. IMPRESSION: Low lung volumes with bibasilar opacities, left greater than right, atelectasis versus infiltrates. Small bilateral effusions. Mild vascular congestion. Electronically Signed   By: [name] M.D.   On: 07/03/2018 01:02    EKG: Independently reviewed.  Assessment/Plan Principal Problem:   Symptomatic anemia Active Problems:   Angina pectoris (HCC)   Atrial fibrillation (HCC)   HTN (hypertension)   CAD (coronary artery disease)   Long term (current) use of anticoagulants   Recurrent UTI   Pulmonary edema    1. Symptomatic anemia - possibly due to recurrent  episodes epistaxis (none at the moment though) 1. 2u PRBC transfusion ordered 2. Hemoccult ordered and pending - from history it sounds like patient not having a rapid fast GIB though 1. On further questioning, patient reveals that she has had frequent and recurrent nose bleeds the past couple of months. 3. Repeat H/H post transfusion 4. Anemia pnl has come back suggestive of iron deficiency 5. NPO except sips with meds 6. Cont home PPI 7. May wish to consult GI vs ENT in AM 2. Pulmonary edema / angina pectoris / elevated troponin - in setting of CAD 1. Suspicious that her NSTEMI like presentation today is type 2 (demand ischemia) and secondary to anemia 2. See also cards note 1. Hold anticoagulants 2. Cont metoprolol, imdur, ranexa 3. Serial trops 4. Goal HGB > 8 5. Defer decision to continue ASA for the moment till we get some serial enzymes back and see how she responds to initial treatment, will hold off on ordering this for the moment given possibility of GIB 6. Cards will follow 3. Lasix 40mg  IV x1 now, may need to repeat after transfusion 4. Strict intake and output 5. Tele monitor 3. A.Fib - 1. Cont metoprolol for rate control 2. Holding eliquis 4. HTN - 1. Holding norvasc and ARB for the moment 2. Cont metoprolol, imdur 5. H/o Recurrent UTIs - 1. Cont chronic TMP   DVT prophylaxis: SCDs Code Status: Full Family Communication: No family in room Disposition Plan: Home after admit Consults called: Cards consult in chart Admission status: Admit to inpatient  Severity of Illness: The appropriate patient status for this patient is INPATIENT. Inpatient status is judged to be reasonable and necessary in order to provide the required intensity of service to ensure the patient's safety. The patient's presenting symptoms, physical exam findings, and initial radiographic and laboratory data in the context of their chronic comorbidities is felt to place them at high risk for  further clinical deterioration. Furthermore, it is not anticipated that the patient will be medically stable for discharge from the hospital within 2 midnights of admission. The following factors support the patient status of inpatient.   "  The patient's presenting symptoms include CP, SOB. " The worrisome physical exam findings include rhonchi. " The initial radiographic and laboratory data are worrisome because of HGB 6.6, Trop 0.26. " The chronic co-morbidities include CAD, HTN, A.Fib on eliquis.   * I certify that at the point of admission it is my clinical judgment that the patient will require inpatient hospital care spanning beyond 2 midnights from the point of admission due to high intensity of service, high risk for further deterioration and high frequency of surveillance required.*    ,  M. DO [facility]  How to contact the [facility] Attending or Consulting provider [place] or covering provider during after hours Industry, for this patient?  1. Check the care team in [facility] and look for a) attending/consulting TR[facility] provider listed and b) the [facility] team listed 2. Log into [email or web address]  Amion Physician Scheduling and messaging for groups and whole hospitals  On call and physician scheduling software for group practices, residents, hospitalists and other medical providers for call, clinic, rotation and shift schedules. On[employer] is a hospital-wide system for scheduling doctors and paging doctors on call. EasyPlot is for scientific plotting and data analysis.  [email or web address]  and use [place]'s universal password to access. If you do not have the password, please contact the hospital operator.  3. Locate the [facility] provider you are looking for under [facility] and page to a number that you can be directly reached. 4. If you still have difficulty reaching the provider, please page the [facility] (Director on Call) for the Hospitalists listed on amion for assistance.   07/03/2018, 2:59 AM

## 2018-07-03 NOTE — Progress Notes (Addendum)
PROGRESS NOTE    [name]  [name]:[id number] DOB: 19-Feb-1935 DOA: 07/02/2018 PCP: Leanna Battles, MD   Brief Narrative:  Per HPI:  [name] is a 84 y.o. female with medical history significant of CAD s/p CABG, chronic chest pain, all grafts of CABG except LIMA to LAD are down apparently, A.Fib on eliquis, HTN. Patient presents to the ED with worsening CP with exertion for some time recently.  Saw Dr. Claiborne Billings for this a couple of days ago in Cards clinic.  At that visit metoprolol and Ranexa were increased to help with CP. Today she presents to the ED with worsening generalized weakness, nausea, and lightheadedness.   ED Course: HGB 6.6, Trop 0.26.  CXR shows pulmonary vascular congestion. Denies hematochezia, melena, hemoptysis. After extensive questioning, patient reveals she has had frequent nose bleeds over the past couple of months. Hemoccult ordered and pending. EDP gave 40mg  lasix IV x1 and ordered 2u PRBC transfusion. Cards consult in chart.   Assessment & Plan:   Principal Problem:   Symptomatic anemia Active Problems:   Angina pectoris (HCC)   Atrial fibrillation (HCC)   HTN (hypertension)   CAD (coronary artery disease)   Long term (current) use of anticoagulants   Recurrent UTI   Pulmonary edema   Recurrent epistaxis   1. Symptomatic anemia - possibly due to recurrent episodes epistaxis (none at the moment though) 1. 2u PRBC transfusion ordered and trtansfusing 2. Hemoccult ordered and still pending - I agree from history it sounds like patient not having a rapid fast GIB though 1. On further questioning, patient reveals that she has had frequent and recurrent nose bleeds the past couple of months, although none noted this AM 3. Agree, Repeat H/H post transfusion 4. Anemia pnl has come back suggestive of iron deficiency 5. NPO except sips with meds 6. Cont home PPI 7. Will hold off on consult GI as stool still pending, ENT as outpt as no epistaxis  since in hospital 2. Pulmonary edema / angina pectoris / elevated troponin - in setting of CAD 1. Suspicious that her NSTEMI like presentation today is type 2 (demand ischemia) and secondary to anemia 2. F/up cards 3. Lasix 40mg  IV x1 on admission, repeat CXR in AM, may need to repeat after transfusion 4. Strict intake and output 5. Tele monitor 3. A.Fib - 1. Cont metoprolol for rate control 2. Holding eliquis 2/2 GI Bleed 4. HTN - 1. Holding norvasc and ARB for the moment 2. Cont metoprolol, imdur 5. H/o Recurrent UTIs - 1. Cont chronic TMP   DVT prophylaxis: SCD/Compression stockings  Code Status: Full    Code Status Orders  (From admission, onward)         Start     Ordered   07/03/18 0240  Full code  Continuous     07/03/18 0241        Code Status History    This patient has a current code status but no historical code status.     Family Communication: Spoke Mr Fahey, answered all questions Disposition Plan:   Pt will remain inpatient for transfuison of blood products and further eval and treatment for source of blood loss and continued subspecialty evaluation by cardiology.  Risks include hemodynamic collapse and worsening cardiac function Consults called: None Admission status: Inpatient   Consultants:   cardiology  Procedures:  Dg Chest 2 View  Result Date: 07/03/2018 CLINICAL DATA:  Chest pain, shortness of breath EXAM: CHEST - 2 VIEW COMPARISON:  None. FINDINGS: Prior median sternotomy and CABG. Low lung volumes. Mild vascular congestion. Bibasilar opacities with small effusions. No acute bony abnormality. IMPRESSION: Low lung volumes with bibasilar opacities, left greater than right, atelectasis versus infiltrates. Small bilateral effusions. Mild vascular congestion. Electronically Signed   By: [name] M.D.   On: 07/03/2018 01:02     Antimicrobials:   none    Subjective: Pt did well ovenright, reports no epistaxis but does report  feeling weak  Objective: Vitals:   07/03/18 0606 07/03/18 0636 07/03/18 0730 07/03/18 0913  BP: 121/82 109/63 (!) 137/121 136/67  Pulse: 73  74 70  Resp: 19 (!) 26 (!) 25   Temp:   98.2 F (36.8 C)   TempSrc:   Oral   SpO2: 98% 96% 97% 96%  Weight:      Height:        Intake/Output Summary (Last 24 hours) at 07/03/2018 1107 Last data filed at 07/03/2018 0900 Gross per 24 hour  Intake -  Output 250 ml  Net -250 ml   Filed Weights   07/02/18 2346 07/03/18 0537  Weight: 60.6 kg 61.6 kg    Examination:  General exam: Appears calm and comfortable, Respiratory system: mild rhonchi, mostly cleared with coughing, no acc muscle use Cardiovascular system: S1 & S2 heard, RRR. No JVD, murmurs, rubs, gallops or clicks. No pedal edema. Gastrointestinal system: Abdomen is nondistended, soft and nontender. No organomegaly or masses felt. Normal bowel sounds heard. Central nervous system: Alert and oriented. No focal neurological deficits. Extremities: WWP, no edema Skin: No rashes, lesions or ulcers Psychiatry: Judgement and insight appear normal. Mood & affect appropriate.     Data Reviewed: I have personally reviewed following labs and imaging studies  CBC: Recent Labs  Lab 07/03/18 0015 07/03/18 0752 07/03/18 0914  WBC 9.1  --   --   HGB 6.6* 8.7* 8.9*  HCT 21.0* 27.0* 27.2*  MCV 94.2  --   --   PLT 124*  --   --    Basic Metabolic Panel: Recent Labs  Lab 07/03/18 0015  NA 127*  K 3.9  CL 97*  CO2 20*  GLUCOSE 125*  BUN 11  CREATININE 0.83  CALCIUM 8.6*   GFR: Estimated Creatinine Clearance: 44.7 mL/min (by C-G formula based on SCr of 0.83 mg/dL). Liver Function Tests: Recent Labs  Lab 07/03/18 0015  AST 32  ALT 19  ALKPHOS 63  BILITOT 1.0  PROT 6.8  ALBUMIN 3.0*   No results for input(s): LIPASE, AMYLASE in the last 168 hours. No results for input(s): AMMONIA in the last 168 hours. Coagulation Profile: No results for input(s): INR, PROTIME in the  last 168 hours. Cardiac Enzymes: Recent Labs  Lab 07/03/18 0015 07/03/18 0251 07/03/18 0752  TROPONINI 0.26* 0.28* 0.21*   BNP (last 3 results) No results for input(s): PROBNP in the last 8760 hours. HbA1C: No results for input(s): HGBA1C in the last 72 hours. CBG: Recent Labs  Lab 07/03/18 0015  GLUCAP 127*   Lipid Profile: No results for input(s): CHOL, HDL, LDLCALC, TRIG, CHOLHDL, LDLDIRECT in the last 72 hours. Thyroid Function Tests: No results for input(s): TSH, T4TOTAL, FREET4, T3FREE, THYROIDAB in the last 72 hours. Anemia Panel: Recent Labs    07/03/18 0148  VITAMINB12 409  FOLATE 13.7  FERRITIN 16  TIBC 504*  IRON 15*  RETICCTPCT 4.3*   Sepsis Labs: No results for input(s): PROCALCITON, LATICACIDVEN in the last 168 hours.  Recent Results (from the  past 240 hour(s))  SARS Coronavirus 2 [facility] order, Performed in [facility] hospital lab)     Status: None   Collection Time: 07/03/18  1:32 AM  Result Value Ref Range Status   SARS Coronavirus 2 NEGATIVE NEGATIVE Final    Comment: (NOTE) If result is NEGATIVE SARS-CoV-2 target nucleic acids are NOT DETECTED. The SARS-CoV-2 RNA is generally detectable in upper and lower  respiratory specimens during the acute phase of infection. The lowest  concentration of SARS-CoV-2 viral copies this assay can detect is 250  copies / mL. A negative result does not preclude SARS-CoV-2 infection  and should not be used as the sole basis for treatment or other  patient management decisions.  A negative result may occur with  improper specimen collection / handling, submission of specimen other  than nasopharyngeal swab, presence of viral mutation(s) within the  areas targeted by this assay, and inadequate number of viral copies  (<250 copies / mL). A negative result must be combined with clinical  observations, patient history, and epidemiological information. If result is POSITIVE SARS-CoV-2 target nucleic acids are  DETECTED. The SARS-CoV-2 RNA is generally detectable in upper and lower  respiratory specimens dur ing the acute phase of infection.  Positive  results are indicative of active infection with SARS-CoV-2.  Clinical  correlation with patient history and other diagnostic information is  necessary to determine patient infection status.  Positive results do  not rule out bacterial infection or co-infection with other viruses. If result is PRESUMPTIVE POSTIVE SARS-CoV-2 nucleic acids MAY BE PRESENT.   A presumptive positive result was obtained on the submitted specimen  and confirmed on repeat testing.  While 2019 novel coronavirus  (SARS-CoV-2) nucleic acids may be present in the submitted sample  additional confirmatory testing may be necessary for epidemiological  and / or clinical management purposes  to differentiate between  SARS-CoV-2 and other Sarbecovirus currently known to infect humans.  If clinically indicated additional testing with an alternate test  methodology [phone]) is advised. The SARS-CoV-2 RNA is generally  detectable in upper and lower respiratory sp ecimens during the acute  phase of infection. The expected result is Negative. Fact Sheet for Patients:  StrictlyIdeas.no Fact Sheet for Healthcare Providers: BankingDealers.co.za This test is not yet approved or cleared by the [place] FDA and has been authorized for detection and/or diagnosis of SARS-CoV-2 by FDA under an Emergency Use Authorization (EUA).  This EUA will remain in effect (meaning this test can be used) for the duration of the COVID-19 declaration under Section 564(b)(1) of the Act, 21 U.S.C. section 360bbb-3(b)(1), unless the authorization is terminated or revoked sooner. Performed at [facility], [place] [address], [place], [place] [address]          Radiology Studies: Dg Chest 2 View  Result Date: 07/03/2018 CLINICAL DATA:  Chest pain,  shortness of breath EXAM: CHEST - 2 VIEW COMPARISON:  None. FINDINGS: Prior median sternotomy and CABG. Low lung volumes. Mild vascular congestion. Bibasilar opacities with small effusions. No acute bony abnormality. IMPRESSION: Low lung volumes with bibasilar opacities, left greater than right, atelectasis versus infiltrates. Small bilateral effusions. Mild vascular congestion. Electronically Signed   By: [name] M.D.   On: 07/03/2018 01:02        Scheduled Meds: . sodium chloride   Intravenous Once  . atorvastatin  40 mg Oral QHS  . isosorbide mononitrate  120 mg Oral Daily  . metoprolol tartrate  37.5-50 mg Oral BID  .  pantoprazole  40 mg Oral Daily  . ranolazine  1,000 mg Oral BID  . trimethoprim  100 mg Oral Daily   Continuous Infusions:   LOS: 0 days    Time spent: 35 min    Nicolette Bang, MD [facility]  If 7PM-7AM, please contact night-coverage  07/03/2018, 11:07 AM

## 2018-07-03 NOTE — ED Notes (Signed)
  Attempted to call report.  RN will call me back. 

## 2018-07-03 NOTE — ED Provider Notes (Addendum)
Emergency Department Provider Note   I have reviewed the triage vital signs and the nursing notes.   HISTORY  Chief Complaint Weakness and Nausea   HPI [name] is a 83 y.o. female with multiple medical problems documented below who presents the emergency department today with shortness of breath, chest pain, nausea, vomiting and weakness.  History is obtained from the patient and the notes it appears that the patient had been having what sounds like some anginal pain for couple weeks.   She was seen in the office a few days ago and her medications were adjusted to include her Imdur and Ranexa shortly after this she started having worsening shortness of breath and then today she has significant weakness in bilateral lower extremities, dyspnea on exertion and generalized fatigue with nausea.  She had some chest pain but not today.  No fevers or cough.  No sick contacts.  History patient has a history of a CABG with a catheterization recently that showed a patent LIMA to LAD but everything else was occluded.  She has been treated medically for this and apparently is not a candidate for intervention. No other associated or modifying symptoms.    Past Medical History:  Diagnosis Date  . Anxiety   . Atrial fibrillation ([place])   . Coronary artery disease    STATUS POST CABG  . DA (degenerative arthritis)   . Hearing loss   . Heartburn   . Hyperlipidemia   . Hypertension   . Ovarian cyst    h/o complex right ovarian cyst/followed yearly  . Skin cancer     Patient Active Problem List   Diagnosis Date Noted  . Symptomatic anemia 07/03/2018  . Pulmonary edema 07/03/2018  . Recurrent epistaxis 07/03/2018  . Bruit of left carotid artery 04/11/2013  . Vertigo 04/11/2013  . Recurrent UTI 09/01/2012  . Long term (current) use of anticoagulants 06/03/2011  . Angina pectoris ([place]) 05/28/2011  . Atrial fibrillation ([place]) 05/28/2011  . HTN (hypertension) 05/28/2011  . CAD  (coronary artery disease) 05/28/2011  . Coronary artery disease   . Hyperlipidemia   . Anxiety   . DA (degenerative arthritis)   . Skin cancer   . Hearing loss   . Heartburn     Past Surgical History:  Procedure Laterality Date  . CARDIAC CATHETERIZATION  04/16/2007   NORMAL LEFT VENTRICULAR SIZE AND CONTRACTILITY WITH NORMAL SYSTOLIC FUNCTION. EF 60%. THERE IS MILD TO MODERATE MITRAL INSUFFICIENCY  . CHOLECYSTECTOMY    . CORONARY ARTERY BYPASS GRAFT  1986   X3, LIMA GRAFT TO THE LAD. SHE HAS POOR TARGETS  . HYSTEROSCOPY  8/04   polyp resection  . NEPHRECTOMY     LEFT  . SKIN CANCER EXCISION    . TONSILLECTOMY    . TUBAL LIGATION      Current Outpatient Rx  . Order #: [id number] Class: Normal  . Order #: [id number] Class: Normal  . Order #: [id number] Class: Normal  . Order #: [id number] Class: Normal  . Order #: [id number] Class: Normal  . Order #: [id number] Class: Normal  . Order #: [id number] Class: Normal  . Order #: [id number] Class: Historical Med  . Order #: [id number] Class: Normal  . Order #: [id number] Class: Historical Med  . Order #: [id number] Class: Historical Med  . Order #: [id number] Class: Normal  . Order #: [id number] Class: Normal    Allergies Demerol; Niacin; Niaspan [niacin er]; Sulfa antibiotics; and Tape  Family History  Problem Relation Age of Onset  . Hypertension Father   .  Breast cancer Sister     Social History Social History   Tobacco Use  . Smoking status: Never Smoker  . Smokeless tobacco: Never Used  Substance Use Topics  . Alcohol use: No  . Drug use: No    Review of Systems  All other systems negative except as documented in the HPI. All pertinent positives and negatives as reviewed in the HPI. ____________________________________________   PHYSICAL EXAM:  VITAL SIGNS: ED Triage Vitals  Enc Vitals Group     BP 07/02/18 2342 (!) 117/59     Pulse Rate 07/02/18 2342 65     Resp 07/02/18 2342 (!) 29     Temp 07/02/18 2345 98 F (36.7 C)      Temp Source 07/02/18 2345 Oral     SpO2 07/02/18 2342 100 %     Weight 07/02/18 2346 133 lb 9.6 oz (60.6 kg)     Height 07/02/18 2346 5\' 1"  (1.549 m)    Constitutional: Alert and oriented. Well appearing and in no acute distress. Eyes: Conjunctivae are normal. PERRL. EOMI. Head: Atraumatic. Nose: No congestion/rhinnorhea. Mouth/Throat: Mucous membranes are moist.  Oropharynx non-erythematous. Neck: No stridor.  No meningeal signs.   Cardiovascular: Normal rate, regular rhythm. Good peripheral circulation. Grossly normal heart sounds.   Respiratory: Normal respiratory effort.  No retractions. Lungs bilateral rales. On 2L [place] to keep sats up and improve symptoms. Gastrointestinal: Soft and nontender. No distention.  Musculoskeletal: No lower extremity tenderness nor edema. No gross deformities of extremities. Neurologic:  Normal speech and language. No gross focal neurologic deficits are appreciated.  Skin:  Skin is warm, dry and intact. No rash noted.   ____________________________________________   LABS (all labs ordered are listed, but only abnormal results are displayed)  Labs Reviewed  COMPREHENSIVE METABOLIC PANEL - Abnormal; Notable for the following components:      Result Value   Sodium 127 (*)    Chloride 97 (*)    CO2 20 (*)    Glucose, Bld 125 (*)    Calcium 8.6 (*)    Albumin 3.0 (*)    All other components within normal limits  TROPONIN I - Abnormal; Notable for the following components:   Troponin I 0.26 (*)    All other components within normal limits  CBC - Abnormal; Notable for the following components:   RBC 2.23 (*)    Hemoglobin 6.6 (*)    HCT 21.0 (*)    Platelets 124 (*)    nRBC 0.3 (*)    All other components within normal limits  IRON AND TIBC - Abnormal; Notable for the following components:   Iron 15 (*)    TIBC 504 (*)    Saturation Ratios 3 (*)    All other components within normal limits  RETICULOCYTES - Abnormal; Notable for the  following components:   Retic Ct Pct 4.3 (*)    RBC. 2.26 (*)    Immature Retic Fract 29.1 (*)    All other components within normal limits  CBG MONITORING, ED - Abnormal; Notable for the following components:   Glucose-Capillary 127 (*)    All other components within normal limits  SARS CORONAVIRUS 2 (HOSPITAL ORDER, [place] [facility])  VITAMIN B12  FOLATE  FERRITIN  BRAIN NATRIURETIC PEPTIDE  OCCULT BLOOD X 1 CARD TO LAB, STOOL  HEMOGLOBIN AND HEMATOCRIT, BLOOD  TROPONIN I  TROPONIN I  TROPONIN I  URINALYSIS, ROUTINE W REFLEX MICROSCOPIC  TYPE AND SCREEN  PREPARE  RBC (CROSSMATCH)  ABO/RH   ____________________________________________  EKG   EKG Interpretation  Date/Time:  Friday Jul 02 2018 23:28:18 EDT Ventricular Rate:  64 PR Interval:  192 QRS Duration: 86 QT Interval:  380 QTC Calculation: 392 R Axis:   0 Text Interpretation:  Normal sinus rhythm ST & T wave abnormality, consider inferior ischemia ST & T wave abnormality, consider anterolateral ischemia Abnormal ECG new ST depressions and T wave changes in lateral leads compared to 2009 Confirmed by Merrily Pew [id number]) on 07/02/2018 11:35:35 PM       ____________________________________________  RADIOLOGY  Dg Chest 2 View  Result Date: 07/03/2018 CLINICAL DATA:  Chest pain, shortness of breath EXAM: CHEST - 2 VIEW COMPARISON:  None. FINDINGS: Prior median sternotomy and CABG. Low lung volumes. Mild vascular congestion. Bibasilar opacities with small effusions. No acute bony abnormality. IMPRESSION: Low lung volumes with bibasilar opacities, left greater than right, atelectasis versus infiltrates. Small bilateral effusions. Mild vascular congestion. Electronically Signed   By: [name] M.D.   On: 07/03/2018 01:02    ____________________________________________   PROCEDURES  Procedure(s) performed:   Procedures  CRITICAL CARE Performed by: Merrily Pew Total critical care time:  40 minutes Critical care time was exclusive of separately billable procedures and treating other patients. Critical care was necessary to treat or prevent imminent or life-threatening deterioration. Critical care was time spent personally by me on the following activities: development of treatment plan with patient and/or surrogate as well as nursing, discussions with consultants, evaluation of patient's response to treatment, examination of patient, obtaining history from patient or surrogate, ordering and performing treatments and interventions, ordering and review of laboratory studies, ordering and review of radiographic studies, pulse oximetry and re-evaluation of patient's condition.  ____________________________________________   INITIAL IMPRESSION / ASSESSMENT AND PLAN / ED COURSE  ZAHRIYAH JOO was evaluated in Emergency Department on 07/03/2018 for the symptoms described in the history of present illness. She was evaluated in the context of the global COVID-19 pandemic, which necessitated consideration that the patient might be at risk for infection with the SARS-CoV-2 virus that causes COVID-19. Institutional protocols and algorithms that pertain to the evaluation of patients at risk for COVID-19 are in a state of rapid change based on information released by regulatory bodies including the CDC and federal and state organizations. These policies and algorithms were followed during the patient's care in the ED.  Patient ultimately found to have likely type II demand ischemia causing pulmonary edema and acute respiratory failure secondary to the same.  Hemoglobin 6.6 nauseous from nosebleeds.  Rest of work-up as above.  Discussed with cardiology and will hold on heparin at this time secondary to likely demand issues and being anemic.  Patient consented to transfusion.  Will admit to hospitalist.  Pertinent labs & imaging results that were available during my care of the patient were reviewed  by me and considered in my medical decision making (see chart for details).  ____________________________________________  FINAL CLINICAL IMPRESSION(S) / ED DIAGNOSES  Final diagnoses:  Anemia, unspecified type  NSTEMI (non-ST elevated myocardial infarction) ([place])  Acute pulmonary edema (HCC)    MEDICATIONS GIVEN DURING THIS VISIT:  Medications  0.9 %  sodium chloride infusion (Manually program via Guardrails IV Fluids) (has no administration in time range)  atorvastatin (LIPITOR) tablet 40 mg (has no administration in time range)  isosorbide mononitrate (IMDUR) 24 hr tablet 120 mg (has no administration in time range)  metoprolol tartrate (LOPRESSOR) tablet 37.5-50 mg (has  no administration in time range)  ranolazine (RANEXA) 12 hr tablet 1,000 mg (has no administration in time range)  trimethoprim (TRIMPEX) tablet 100 mg (has no administration in time range)  pantoprazole (PROTONIX) EC tablet 40 mg (has no administration in time range)  acetaminophen (TYLENOL) tablet 650 mg (has no administration in time range)    Or  acetaminophen (TYLENOL) suppository 650 mg (has no administration in time range)  ondansetron (ZOFRAN) tablet 4 mg (has no administration in time range)    Or  ondansetron (ZOFRAN) injection 4 mg (has no administration in time range)  aspirin chewable tablet 324 mg (324 mg Oral Given 07/03/18 0107)  furosemide (LASIX) injection 40 mg (40 mg Intravenous Given 07/03/18 0242)     NEW OUTPATIENT MEDICATIONS STARTED DURING THIS VISIT:  New Prescriptions   No medications on file    Note:  This note was prepared with assistance of Dragon voice recognition software. Occasional wrong-word or sound-a-like substitutions may have occurred due to the inherent limitations of voice recognition software.   Lanae Federer, Corene Cornea, MD 07/03/18 5110    Merrily Pew, MD 07/03/18 [phone]

## 2018-07-03 NOTE — ED Notes (Signed)
  Obtained informed consent for blood transfusion.

## 2018-07-03 NOTE — Progress Notes (Signed)
Chest pain in the setting of profound anemia  She is currently pain free Not currently a candidate for anticoagulation or cardiology procedures  Cardiology to follow  Thompson Grayer MD, [place] 07/03/2018 8:31 AM

## 2018-07-04 ENCOUNTER — Inpatient Hospital Stay (HOSPITAL_COMMUNITY): Payer: Medicare Other

## 2018-07-04 DIAGNOSIS — D649 Anemia, unspecified: Secondary | ICD-10-CM

## 2018-07-04 DIAGNOSIS — I480 Paroxysmal atrial fibrillation: Secondary | ICD-10-CM

## 2018-07-04 DIAGNOSIS — I10 Essential (primary) hypertension: Secondary | ICD-10-CM

## 2018-07-04 DIAGNOSIS — I25709 Atherosclerosis of coronary artery bypass graft(s), unspecified, with unspecified angina pectoris: Secondary | ICD-10-CM

## 2018-07-04 LAB — HEMOGLOBIN AND HEMATOCRIT, BLOOD
HCT: 25.1 % — ABNORMAL LOW (ref 36.0–46.0)
Hemoglobin: 8.4 g/dL — ABNORMAL LOW (ref 12.0–15.0)

## 2018-07-04 MED ORDER — FUROSEMIDE 10 MG/ML IJ SOLN
40.0000 mg | Freq: Once | INTRAMUSCULAR | Status: AC
  Administered 2018-07-04: 12:00:00 40 mg via INTRAVENOUS
  Filled 2018-07-04: qty 4

## 2018-07-04 NOTE — Progress Notes (Addendum)
Pt O2 sats 86 % room air.  Lungs with rhochii t/o bil.  Pt has congested nonproductive cough.  Oxygen applied at 2L per [place] with sats 96%.  Will continue to monitor pt closely.

## 2018-07-04 NOTE — Progress Notes (Signed)
PROGRESS NOTE    [name]  EZM:[id number] DOB: February 06, 1935 DOA: 07/02/2018 PCP: Leanna Battles, MD   Brief Narrative:  Per HPI: [name] a 84 y.o.femalewith medical history significant ofCAD s/p CABG, chronic chest pain, all grafts of CABG except LIMA to LAD are down apparently, A.Fib on eliquis, HTN. Patient presents to the ED with worsening CP with exertion for some time recently. Saw Dr. Claiborne Billings for this a couple of days ago in Cards clinic. At that visit metoprolol and Ranexa were increased to help with CP. Today she presents to the ED with worsening generalized weakness, nausea, and lightheadedness.  ED Course:HGB 6.6, Trop 0.26. CXR shows pulmonary vascular congestion. Denies hematochezia, melena, hemoptysis. After extensive questioning, patient reveals she has had frequent nose bleeds over the past couple of months. Hemoccult ordered and pending. EDP gave 40mg  lasix IV x1 and ordered 2u PRBC transfusion. Cards consult in chart.   Assessment & Plan:   Principal Problem:   Symptomatic anemia Active Problems:   Angina pectoris (HCC)   Atrial fibrillation (HCC)   HTN (hypertension)   CAD (coronary artery disease)   Long term (current) use of anticoagulants   Recurrent UTI   Pulmonary edema   Recurrent epistaxis   1. Symptomatic anemia -possibly due to recurrent episodes epistaxis (none at the moment though) 1. 2u PRBC transfused  HGB stable 8.4 2. Hemoccult ordered and still pending -I anticipate this will be positive given her epistaxis and swallowing of blood 3. Anemia pnlhas come back suggestive of iron deficiency 4. Soft diet 5. Cont home PPI 6. Will hold off on consult GI, ENT as outpt as no epistaxis since in hospital 2. Pulmonary edema / angina pectoris / elevated troponin - in setting of CAD 1. Suspicious that her NSTEMI like presentation today is type 2 (demand ischemia) and secondary to anemia 2. F/up cards 3. Lasix 40mg  IV x1 on  admission, repeat CXR shws edema, lasix 40 mg x 1 5/3 4. Strict intake and output 5. Tele monitor 3. A.Fib - 1. Cont metoprolol for rate control 2. Holding eliquis 2/2 GI Bleed 4. HTN - 1. Holding norvasc and ARB for the moment 2. Cont metoprolol, imdur 5. H/o Recurrent UTIs - 1. Cont chronic TMP  DVT prophylaxis: SCD/Compression stockings  Code Status: FULL    Code Status Orders  (From admission, onward)         Start     Ordered   07/03/18 0240  Full code  Continuous     07/03/18 0241        Code Status History    This patient has a current code status but no historical code status.     Family Communication: None today  Disposition Plan:   Pt will remain inpatient for IV diuresis, and continued subspecialty evaluation by cardiology.  Without these treatments, risks include hemodynamic collapse and worsening cardiac function Consults called: None Admission status: Inpatient   Consultants:   cardiology  Procedures:  Dg Chest 2 View  Result Date: 07/03/2018 CLINICAL DATA:  Chest pain, shortness of breath EXAM: CHEST - 2 VIEW COMPARISON:  None. FINDINGS: Prior median sternotomy and CABG. Low lung volumes. Mild vascular congestion. Bibasilar opacities with small effusions. No acute bony abnormality. IMPRESSION: Low lung volumes with bibasilar opacities, left greater than right, atelectasis versus infiltrates. Small bilateral effusions. Mild vascular congestion. Electronically Signed   By: [name] M.D.   On: 07/03/2018 01:02   Dg Chest [facility] 1 [address]  Result Date: 07/04/2018 CLINICAL DATA:  Edema. EXAM: PORTABLE CHEST 1 VIEW COMPARISON:  Chest x-ray from yesterday. FINDINGS: Stable cardiomediastinal silhouette status post CABG. Unchanged pulmonary vascular congestion and mild interstitial pulmonary edema. Persistent left basilar airspace disease. Unchanged small bilateral pleural effusions. No pneumothorax. No acute osseous abnormality. IMPRESSION: 1. Unchanged mild  pulmonary interstitial edema and small bilateral pleural effusions. 2. Persistent left lower lobe atelectasis versus infiltrate. Electronically Signed   By: Titus Dubin M.D.   On: 07/04/2018 07:51     Antimicrobials:   none    Subjective: Patient reports still feels weak and tired, Denied any additional epistaxis  Objective: Vitals:   07/04/18 0640 07/04/18 0650 07/04/18 0841 07/04/18 1136  BP: 131/69  137/66 127/84  Pulse: 65  64 (!) 51  Resp: 20  (!) 22 (!) 28  Temp: 97.9 F (36.6 C)  99.4 F (37.4 C) 97.9 F (36.6 C)  TempSrc: Oral  Oral Oral  SpO2: 100%  99% 97%  Weight:  60.9 kg    Height:        Intake/Output Summary (Last 24 hours) at 07/04/2018 1233 Last data filed at 07/04/2018 0843 Gross per 24 hour  Intake 582 ml  Output 1100 ml  Net -518 ml   Filed Weights   07/02/18 2346 07/03/18 0537 07/04/18 0650  Weight: 60.6 kg 61.6 kg 60.9 kg    Examination:  General exam: Appears calm and comfortable  Respiratory system: mild rales bilat, trace rhonchi, no acc muscle use. Cardiovascular system: S1 & S2 heard, RRR. No JVD, murmurs, rubs, gallops or clicks. No pedal edema. Gastrointestinal system: Abdomen is nondistended, soft and nontender. No organomegaly or masses felt. Normal bowel sounds heard. Central nervous system: Alert and oriented. No focal neurological deficits. Extremities:WWP, neurovasc intact Skin: No rashes, lesions or ulcers Psychiatry: Judgement and insight appear normal. Mood & affect appropriate.     Data Reviewed: I have personally reviewed following labs and imaging studies  CBC: Recent Labs  Lab 07/03/18 0015 07/03/18 0752 07/03/18 0914 07/04/18 0344  WBC 9.1  --   --   --   HGB 6.6* 8.7* 8.9* 8.4*  HCT 21.0* 27.0* 27.2* 25.1*  MCV 94.2  --   --   --   PLT 124*  --   --   --    Basic Metabolic Panel: Recent Labs  Lab 07/03/18 0015  NA 127*  K 3.9  CL 97*  CO2 20*  GLUCOSE 125*  BUN 11  CREATININE 0.83  CALCIUM 8.6*    GFR: Estimated Creatinine Clearance: 44.5 mL/min (by C-G formula based on SCr of 0.83 mg/dL). Liver Function Tests: Recent Labs  Lab 07/03/18 0015  AST 32  ALT 19  ALKPHOS 63  BILITOT 1.0  PROT 6.8  ALBUMIN 3.0*   No results for input(s): LIPASE, AMYLASE in the last 168 hours. No results for input(s): AMMONIA in the last 168 hours. Coagulation Profile: No results for input(s): INR, PROTIME in the last 168 hours. Cardiac Enzymes: Recent Labs  Lab 07/03/18 0015 07/03/18 0251 07/03/18 0752 07/03/18 1404  TROPONINI 0.26* 0.28* 0.21* 0.21*   BNP (last 3 results) No results for input(s): PROBNP in the last 8760 hours. HbA1C: No results for input(s): HGBA1C in the last 72 hours. CBG: Recent Labs  Lab 07/03/18 0015  GLUCAP 127*   Lipid Profile: No results for input(s): CHOL, HDL, LDLCALC, TRIG, CHOLHDL, LDLDIRECT in the last 72 hours. Thyroid Function Tests: No results for input(s): TSH, T4TOTAL, FREET4,  T3FREE, THYROIDAB in the last 72 hours. Anemia Panel: Recent Labs    07/03/18 0148  VITAMINB12 409  FOLATE 13.7  FERRITIN 16  TIBC 504*  IRON 15*  RETICCTPCT 4.3*   Sepsis Labs: No results for input(s): PROCALCITON, LATICACIDVEN in the last 168 hours.  Recent Results (from the past 240 hour(s))  SARS Coronavirus 2 [facility] order, Performed in [facility] hospital lab)     Status: None   Collection Time: 07/03/18  1:32 AM  Result Value Ref Range Status   SARS Coronavirus 2 NEGATIVE NEGATIVE Final    Comment: (NOTE) If result is NEGATIVE SARS-CoV-2 target nucleic acids are NOT DETECTED. The SARS-CoV-2 RNA is generally detectable in upper and lower  respiratory specimens during the acute phase of infection. The lowest  concentration of SARS-CoV-2 viral copies this assay can detect is 250  copies / mL. A negative result does not preclude SARS-CoV-2 infection  and should not be used as the sole basis for treatment or other  patient management decisions.  A  negative result may occur with  improper specimen collection / handling, submission of specimen other  than nasopharyngeal swab, presence of viral mutation(s) within the  areas targeted by this assay, and inadequate number of viral copies  (<250 copies / mL). A negative result must be combined with clinical  observations, patient history, and epidemiological information. If result is POSITIVE SARS-CoV-2 target nucleic acids are DETECTED. The SARS-CoV-2 RNA is generally detectable in upper and lower  respiratory specimens dur ing the acute phase of infection.  Positive  results are indicative of active infection with SARS-CoV-2.  Clinical  correlation with patient history and other diagnostic information is  necessary to determine patient infection status.  Positive results do  not rule out bacterial infection or co-infection with other viruses. If result is PRESUMPTIVE POSTIVE SARS-CoV-2 nucleic acids MAY BE PRESENT.   A presumptive positive result was obtained on the submitted specimen  and confirmed on repeat testing.  While 2019 novel coronavirus  (SARS-CoV-2) nucleic acids may be present in the submitted sample  additional confirmatory testing may be necessary for epidemiological  and / or clinical management purposes  to differentiate between  SARS-CoV-2 and other Sarbecovirus currently known to infect humans.  If clinically indicated additional testing with an alternate test  methodology [phone]) is advised. The SARS-CoV-2 RNA is generally  detectable in upper and lower respiratory sp ecimens during the acute  phase of infection. The expected result is Negative. Fact Sheet for Patients:  StrictlyIdeas.no Fact Sheet for Healthcare Providers: BankingDealers.co.za This test is not yet approved or cleared by the [place] FDA and has been authorized for detection and/or diagnosis of SARS-CoV-2 by FDA under an Emergency Use  Authorization (EUA).  This EUA will remain in effect (meaning this test can be used) for the duration of the COVID-19 declaration under Section 564(b)(1) of the Act, 21 U.S.C. section 360bbb-3(b)(1), unless the authorization is terminated or revoked sooner. Performed at [facility], [place] [address] [place], [place] [address]          Radiology Studies: Dg Chest 2 View  Result Date: 07/03/2018 CLINICAL DATA:  Chest pain, shortness of breath EXAM: CHEST - 2 VIEW COMPARISON:  None. FINDINGS: Prior median sternotomy and CABG. Low lung volumes. Mild vascular congestion. Bibasilar opacities with small effusions. No acute bony abnormality. IMPRESSION: Low lung volumes with bibasilar opacities, left greater than right, atelectasis versus infiltrates. Small bilateral effusions. Mild vascular congestion. Electronically Signed  By: [name] M.D.   On: 07/03/2018 01:02   Dg Chest Port 1 View  Result Date: 07/04/2018 CLINICAL DATA:  Edema. EXAM: PORTABLE CHEST 1 VIEW COMPARISON:  Chest x-ray from yesterday. FINDINGS: Stable cardiomediastinal silhouette status post CABG. Unchanged pulmonary vascular congestion and mild interstitial pulmonary edema. Persistent left basilar airspace disease. Unchanged small bilateral pleural effusions. No pneumothorax. No acute osseous abnormality. IMPRESSION: 1. Unchanged mild pulmonary interstitial edema and small bilateral pleural effusions. 2. Persistent left lower lobe atelectasis versus infiltrate. Electronically Signed   By: Titus Dubin M.D.   On: 07/04/2018 07:51        Scheduled Meds: . sodium chloride   Intravenous Once  . atorvastatin  40 mg Oral QHS  . isosorbide mononitrate  120 mg Oral Daily  . metoprolol tartrate  37.5-50 mg Oral BID  . pantoprazole  40 mg Oral Daily  . ranolazine  1,000 mg Oral BID  . trimethoprim  100 mg Oral Daily   Continuous Infusions:   LOS: 1 day    Time spent: 35 min     Nicolette Bang, MD  [facility]  If 7PM-7AM, please contact night-coverage  07/04/2018, 12:33 PM

## 2018-07-04 NOTE — Progress Notes (Signed)
Physical Therapy Evaluation   Clinical Impression: PTA pt living with husband in single story home with 1 step to enter. Pt reports ambulation limited community distances with cane and independent with ADLs, husband assists with iADLs. Pt is currently limited in safe mobility by orthostatic hypotension and possible oxygen desaturation (see General Comments) in the presence of decreased strength and endurance. PT currently recommends HHPT as anticipate that pt's mobility will improve with improved cardiovascular response to activity. PT will continue to follow acutely and adjust discharge disposition as needed.    07/04/18 0900  PT Visit Information  Last PT Received On 07/04/18  Assistance Needed +2 (for chair follow)  History of Present Illness 84 y.o. female with medical history significant of CAD s/p CABG, chronic chest pain, all grafts of CABG except LIMA to LAD are down apparently, A.Fib on eliquis, HTN. Presents to ED with worsening CP with exertion. generalized weakness, nausea and lightheadedness. Currently being treated for systomatic anemia, pulmonary edema and Afib.  Precautions  Precautions Fall  Restrictions  Weight Bearing Restrictions No  Home Living  Family/patient expects to be discharged to: Private residence  Living Arrangements Spouse/significant other  Available Help at Discharge Family;Available 24 hours/day  Type of Home House  Home Access Stairs to enter  Entrance Stairs-Number of Steps 1  Home Layout One level  Bathroom Shower/Tub Tub/shower unit  [place] - 2 wheels;Cane - single point;Tub bench;Grab bars - tub/shower  Prior Function  Level of Independence Needs assistance  Gait / Transfers Assistance Needed limited community ambulation with cane/RW  ADL's / Homemaking Assistance Needed limited cooking and cleaning, able to perform LE washing and dressing with difficulty  Communication  Communication No  difficulties  Pain Assessment  Pain Assessment No/denies pain  Cognition  Arousal/Alertness Awake/alert  Behavior During Therapy WFL for tasks assessed/performed  Overall Cognitive Status Within Functional Limits for tasks assessed  Upper Extremity Assessment  Upper Extremity Assessment Generalized weakness  Lower Extremity Assessment  Lower Extremity Assessment Generalized weakness  Cervical / Trunk Assessment  Cervical / Trunk Assessment Kyphotic  Bed Mobility  Overal bed mobility Needs Assistance  Bed Mobility Supine to Sit  Supine to sit Supervision  General bed mobility comments supervision for safety, increased time and effort  Transfers  Overall transfer level Needs assistance  Equipment used Rolling walker (2 wheeled)  Transfers Sit to/from Stand  Sit to Stand Min assist  General transfer comment min guard for power up but requires minA for steadying in standing pt reports "my head feels foggy."  Ambulation/Gait  Ambulation/Gait assistance Min assist  Gait Distance (Feet) 3 Feet  Assistive device Rolling walker (2 wheeled)  Gait Pattern/deviations Step-through pattern;Decreased step length - right;Decreased step length - left;Shuffle;Trunk flexed  General Gait Details minA for steadying with slow, shuffling steps from bed to recliner  Gait velocity slowed  Gait velocity interpretation <1.31 ft/sec, indicative of household ambulator  Balance  Overall balance assessment Needs assistance  Sitting-balance support No upper extremity supported;Feet supported  Sitting balance-Leahy Scale Fair  Standing balance support Bilateral upper extremity supported  Standing balance-Leahy Scale Poor  Standing balance comment requires UE support and assist to attain initial static balance, posterior lean on bed surface with LE to steady  General Comments  General comments (skin integrity, edema, etc.) 100% SaO2 on 2L O2 via [place] on entry, pt does not usually utilize supplemental O2 so  removed for therapy. Pt able to maintain SaO2 >90%O2 with bed  mobility and transfers with ambulation SaO2 dropped to 81%O2 poor wave form noted due to increased grip on RW, however also noted to have 3/4 DoE, 2L supplemental O2 replaced and SaO2 quickly rebounded to 100%O2 HR at rest 89 bpm with limited activity max HR 121 bpm BP in supine 137/66 in sitting 107/75 sitting after ambulation 115/63  PT - End of Session  Equipment Utilized During Treatment Gait belt;Oxygen  Activity Tolerance Patient limited by fatigue  Patient left in chair;with call bell/phone within reach;with chair alarm set  Nurse Communication Mobility status  PT Assessment  PT Recommendation/Assessment Patient needs continued PT services  PT Visit Diagnosis Unsteadiness on feet (R26.81);Other abnormalities of gait and mobility (R26.89);Muscle weakness (generalized) (M62.81);Difficulty in walking, not elsewhere classified (R26.2);History of falling (Z91.81);Dizziness and giddiness (R42)  PT Problem List Decreased strength;Decreased activity tolerance;Decreased balance;Decreased mobility;Cardiopulmonary status limiting activity  PT Plan  PT Frequency (ACUTE ONLY) Min 4X/week  PT Treatment/Interventions (ACUTE ONLY) DME instruction;Gait training;Functional mobility training;Therapeutic activities;Therapeutic exercise;Stair training;Balance training;Cognitive remediation;Patient/family education  AM-PAC PT "6 Clicks" Mobility Outcome Measure (Version 2)  Help needed turning from your back to your side while in a flat bed without using bedrails? 4  Help needed moving from lying on your back to sitting on the side of a flat bed without using bedrails? 4  Help needed moving to and from a bed to a chair (including a wheelchair)? 3  Help needed standing up from a chair using your arms (e.g., wheelchair or bedside chair)? 3  Help needed to walk in hospital room? 3  Help needed climbing 3-5 steps with a railing?  2  6 Click Score 19   Consider Recommendation of Discharge To: Home with [facility]  PT Recommendation  Recommendations for Other Services OT consult  Follow Up Recommendations Home health PT;Supervision/Assistance - 24 hour  PT equipment None recommended by PT  Individuals Consulted  Consulted and Agree with Results and Recommendations Patient  Acute Rehab PT Goals  Patient Stated Goal go home today  PT Goal Formulation With patient  Time For Goal Achievement 07/18/18  Potential to Achieve Goals Fair  PT Time Calculation  PT Start Time (ACUTE ONLY) 0948  PT Stop Time (ACUTE ONLY) 1016  PT Time Calculation (min) (ACUTE ONLY) 28 min  PT General Charges  $$ ACUTE PT VISIT 1 Visit  PT Evaluation  $PT Eval Moderate Complexity 1 Mod  PT Treatments  $Gait Training 8-22 mins   Keiondre Colee B. Migdalia Dk PT, DPT Acute Rehabilitation Services Pager [phone] Office [phone]

## 2018-07-04 NOTE — Progress Notes (Signed)
Progress Note   Subjective   Doing well today, the patient denies CP or SOB.  No new concerns.  She states "I think IM a little better".  Inpatient Medications    Scheduled Meds: . sodium chloride   Intravenous Once  . atorvastatin  40 mg Oral QHS  . isosorbide mononitrate  120 mg Oral Daily  . metoprolol tartrate  37.5-50 mg Oral BID  . pantoprazole  40 mg Oral Daily  . ranolazine  1,000 mg Oral BID  . trimethoprim  100 mg Oral Daily   Continuous Infusions:  PRN Meds: acetaminophen **OR** acetaminophen, ondansetron **OR** ondansetron (ZOFRAN) IV   Vital Signs    Vitals:   07/03/18 2300 07/04/18 0640 07/04/18 0650 07/04/18 0841  BP: (!) 149/74 131/69  137/66  Pulse: 66 65  64  Resp: (!) 25 20  (!) 22  Temp:  97.9 F (36.6 C)  99.4 F (37.4 C)  TempSrc:  Oral  Oral  SpO2: 99% 100%  99%  Weight:   60.9 kg   Height:        Intake/Output Summary (Last 24 hours) at 07/04/2018 0858 Last data filed at 07/04/2018 0843 Gross per 24 hour  Intake 642 ml  Output 1550 ml  Net -908 ml   Filed Weights   07/02/18 2346 07/03/18 0537 07/04/18 0650  Weight: 60.6 kg 61.6 kg 60.9 kg    Telemetry    Sinus with frequent PACs - Personally Reviewed  Physical Exam   GEN- The patient is elderly appearing, alert and oriented x 3 today.   Head- normocephalic, atraumatic Eyes-  Sclera clear, conjunctiva pink Ears- hearing intact Oropharynx- clear Neck- supple, Lungs-  normal work of breathing Heart- Regular rate and rhythm  GI- soft, NT, ND, + BS Extremities- no clubbing, cyanosis, or edema  MS- diffuse atrophy Skin- no rash or lesion Psych- euthymic mood, full affect Neuro- strength and sensation are intact   Labs    Chemistry Recent Labs  Lab 07/03/18 0015  NA 127*  K 3.9  CL 97*  CO2 20*  GLUCOSE 125*  BUN 11  CREATININE 0.83  CALCIUM 8.6*  PROT 6.8  ALBUMIN 3.0*  AST 32  ALT 19  ALKPHOS 63  BILITOT 1.0  GFRNONAA >60  GFRAA >60  ANIONGAP 10      Hematology Recent Labs  Lab 07/03/18 0015 07/03/18 0148 07/03/18 0752 07/03/18 0914 07/04/18 0344  WBC 9.1  --   --   --   --   RBC 2.23* 2.26*  --   --   --   HGB 6.6*  --  8.7* 8.9* 8.4*  HCT 21.0*  --  27.0* 27.2* 25.1*  MCV 94.2  --   --   --   --   MCH 29.6  --   --   --   --   MCHC 31.4  --   --   --   --   RDW 14.9  --   --   --   --   PLT 124*  --   --   --   --     Cardiac Enzymes Recent Labs  Lab 07/03/18 0015 07/03/18 0251 07/03/18 0752 07/03/18 1404  TROPONINI 0.26* 0.28* 0.21* 0.21*   No results for input(s): TROPIPOC in the last 168 hours.      Patient Profile:   [name] is a 84 y.o. female with a hx of CAD s/p CABG, chronic chest pain, HLD,  A. Fib on eliquis and HTN who is being seen today for the evaluation of chest pain.  Assessment & Plan    1.  Angina with known CAD s/p prior CABG Occurred in the setting of profound anemia, with minor troponin elevation. She is better overnight after transfusion Medicine team to continue to manage anemia Not a candidate for anticoagulation or cardiology procedures at this time.  2. afib Currently in sinus rhythm Anticoagulation on hold due to profound anemia  3. HTN Stable No change required today  4. HL Stable No change required today On statin  Thompson Grayer MD, [facility] 07/04/2018 8:58 AM

## 2018-07-05 DIAGNOSIS — L8990 Pressure ulcer of unspecified site, unspecified stage: Secondary | ICD-10-CM

## 2018-07-05 LAB — HEMOGLOBIN AND HEMATOCRIT, BLOOD
HCT: 25.4 % — ABNORMAL LOW (ref 36.0–46.0)
Hemoglobin: 8.2 g/dL — ABNORMAL LOW (ref 12.0–15.0)

## 2018-07-05 MED ORDER — FUROSEMIDE 10 MG/ML IJ SOLN
40.0000 mg | Freq: Once | INTRAMUSCULAR | Status: AC
  Administered 2018-07-05: 08:00:00 40 mg via INTRAVENOUS
  Filled 2018-07-05: qty 4

## 2018-07-05 NOTE — TOC Initial Note (Signed)
Transition of Care [facility]) - Initial/Assessment Note    Patient Details  Name: [name] MRN: [id number] Date of Birth: Dec 31, 1934  Transition of Care [facility]) CM/SW Contact:    Bethena Roys, RN Phone Number: 07/05/2018, 2:16 PM  Clinical Narrative:  Pt presented for symptomatic anemia. PTA from home with support of husband. Pt sates she has a cane in the home for DME. PT recommendations for [facility] PT. CM did discuss with patient- not feeling good at the time of visit. RN had ambulated patient and 02 has been weaned. Pt's BP low and she felt nauseated. CM will continue to monitor for additional transition of care needs.                  Expected Discharge Plan: [place] Barriers to Discharge: Continued Medical Work up(aBlood Pressure low with ambulation)   Patient Goals and CMS Choice Patient states their goals for this hospitalization and ongoing recovery are:: to feel better      Expected Discharge Plan and Services Expected Discharge Plan: [place] In-house Referral: NA Discharge Planning Services: CM Consult Post Acute Care Choice: [place] arrangements for the past 2 months: Single Family Home                  Prior Living Arrangements/Services Living arrangements for the past 2 months: Single Family Home Lives with:: Spouse Patient language and need for interpreter reviewed:: Yes Do you feel safe going back to the place where you live?: Yes      Need for Family Participation in Patient Care: Yes (Comment) Care giver support system in place?: Yes (comment) Current home services: DME(Pt has a cane at home) Criminal Activity/Legal Involvement Pertinent to Current Situation/Hospitalization: No - Comment as needed  Activities of Daily Living      Permission Sought/Granted Permission sought to share information with : Family Supports                Emotional Assessment Appearance:: Appears stated  age Attitude/Demeanor/Rapport: Engaged Affect (typically observed): Accepting Orientation: : Oriented to Self, Oriented to Place, Oriented to  Time, Oriented to Situation Alcohol / Substance Use: Not Applicable Psych Involvement: No (comment)  Admission diagnosis:  Acute pulmonary edema (HCC) [J81.0] NSTEMI (non-ST elevated myocardial infarction) ([place]) [I21.4] Anemia, unspecified type [D64.9] Patient Active Problem List   Diagnosis Date Noted  . Pressure injury of skin 07/05/2018  . Symptomatic anemia 07/03/2018  . Pulmonary edema 07/03/2018  . Recurrent epistaxis 07/03/2018  . Bruit of left carotid artery 04/11/2013  . Vertigo 04/11/2013  . Recurrent UTI 09/01/2012  . Long term (current) use of anticoagulants 06/03/2011  . Angina pectoris ([place]) 05/28/2011  . Atrial fibrillation ([place]) 05/28/2011  . HTN (hypertension) 05/28/2011  . CAD (coronary artery disease) 05/28/2011  . Coronary artery disease   . Hyperlipidemia   . Anxiety   . DA (degenerative arthritis)   . Skin cancer   . Hearing loss   . Heartburn    PCP:  Leanna Battles, MD Pharmacy:   [place], [place] - [address] [place] [address] Phone: [phone] Fax: [place], [place] - [place] [address] Phone: [phone] Fax: [phone]     Social Determinants of Health (SDOH) Interventions    Readmission Risk Interventions No flowsheet data found.

## 2018-07-05 NOTE — Progress Notes (Signed)
Pt reports nausea and states she ate too much, denies pain or any other c/o.   LBM noted to be 5/1, pt states that is normal for her.  Abdomen soft and distended with active BS.  Pt also has room air saturation of 88-90%.  Pt denies SOB, lungs with bibailar rales, no cough at present.  4 mg PRN zofran given per orders and 2 L O2 per [place] applied.  Sats 98% at present.  Pt reports nausea improving, currently talking on phone.  Will continue to monitor patient closely.

## 2018-07-05 NOTE — Progress Notes (Signed)
OT Cancellation Note  Patient Details Name: [name] MRN: [id number] DOB: [birth year]   Cancelled Treatment:    Reason Eval/Treat Not Completed: Fatigue/lethargy limiting ability to participate(pt politely declined, not feeling well). Per nursing note, pt with symptomatic orthostatic hypotension with ambulation earlier this afternoon. Will follow.  Malka So 07/05/2018, 3:54 PM  Nestor Lewandowsky, OTR/L Acute Rehabilitation Services Pager: [phone] Office: [phone]

## 2018-07-05 NOTE — Progress Notes (Signed)
PROGRESS NOTE    [name]  EPP:[id number] DOB: Aug 07, 1934 DOA: 07/02/2018 PCP: Leanna Battles, MD   Brief Narrative:  Per HPI: [name] a 84 y.o.femalewith medical history significant ofCAD s/p CABG, chronic chest pain, all grafts of CABG except LIMA to LAD are down apparently, A.Fib on eliquis, HTN. Patient presents to the ED with worsening CP with exertion for some time recently. Saw Dr. Claiborne Billings for this a couple of days ago in Cards clinic. At that visit metoprolol and Ranexa were increased to help with CP. Today she presents to the ED with worsening generalized weakness, nausea, and lightheadedness.  ED Course:HGB 6.6, Trop 0.26. CXR shows pulmonary vascular congestion. Denies hematochezia, melena, hemoptysis. After extensive questioning, patient reveals she has had frequent nose bleeds over the past couple of months. Hemoccult ordered and pending. EDP gave 40mg  lasix IV x1 and ordered 2u PRBC transfusion. Cards consult in chart.   Assessment & Plan:   Principal Problem:   Symptomatic anemia Active Problems:   Angina pectoris (HCC)   Atrial fibrillation (HCC)   HTN (hypertension)   CAD (coronary artery disease)   Long term (current) use of anticoagulants   Recurrent UTI   Pulmonary edema   Recurrent epistaxis   Pressure injury of skin   1. Symptomatic anemia -possibly due to recurrent episodes epistaxis (none at the moment though) 1. 2u PRBC transfused  HGB stable 8.2 2. Hemoccult ordered andstillpending -I anticipate this will be positive given her epistaxis and swallowing of blood 3. Anemia pnlhas come back suggestive of iron deficiency 4. Soft diet 5. Cont home PPI 6. Will hold off onconsult GI, ENT as outpt as no epistaxis since in hospital 2. Pulmonary edema / angina pectoris / elevated troponin - in setting of CAD 1. Suspicious that her NSTEMI like presentation today is type 2 (demand ischemia) and secondary to anemia 2. F/up  cards 3. Lasix 40mg  IV x1on admission, repeat CXR shws edema, lasix 40 mg x 1 5/3, 5/4 but pt now slightly orthostatic, hold lasix 4. Strict intake and output 5. Tele monitor 3. A.Fib - 1. Cont metoprolol for rate control 2. Holding eliquis2/2 GI Bleed 4. HTN - 1. Holding norvasc and ARB for the moment 2. Cont metoprolol, imdur 5. H/o Recurrent UTIs - 1. Cont chronic TMP  DVT prophylaxis: SCD/Compression stockings  Code Status: Full    Code Status Orders  (From admission, onward)         Start     Ordered   07/03/18 0240  Full code  Continuous     07/03/18 0241        Code Status History    This patient has a current code status but no historical code status.     Family Communication: none today Disposition Plan:   Pt will remain inpatient 2/2 orthostatic hypotension and weakness, PT/OT eval-possible SAYTKZS, and continued subspecialty evaluation by cardiology. Without these treatments, risks include hemodynamic collapse and worsening cardiac function Consults called: None Consults called: None Admission status: Inpatient   Consultants:   None  Procedures:  Dg Chest 2 View  Result Date: 07/03/2018 CLINICAL DATA:  Chest pain, shortness of breath EXAM: CHEST - 2 VIEW COMPARISON:  None. FINDINGS: Prior median sternotomy and CABG. Low lung volumes. Mild vascular congestion. Bibasilar opacities with small effusions. No acute bony abnormality. IMPRESSION: Low lung volumes with bibasilar opacities, left greater than right, atelectasis versus infiltrates. Small bilateral effusions. Mild vascular congestion. Electronically Signed   By: Lennette Bihari  [place] M.D.   On: 07/03/2018 01:02   Dg Chest Port 1 View  Result Date: 07/04/2018 CLINICAL DATA:  Edema. EXAM: PORTABLE CHEST 1 VIEW COMPARISON:  Chest x-ray from yesterday. FINDINGS: Stable cardiomediastinal silhouette status post CABG. Unchanged pulmonary vascular congestion and mild interstitial pulmonary edema. Persistent left  basilar airspace disease. Unchanged small bilateral pleural effusions. No pneumothorax. No acute osseous abnormality. IMPRESSION: 1. Unchanged mild pulmonary interstitial edema and small bilateral pleural effusions. 2. Persistent left lower lobe atelectasis versus infiltrate. Electronically Signed   By: Titus Dubin M.D.   On: 07/04/2018 07:51     Antimicrobials:   none   Subjective: Patient O2 saturations have improved although she is generally weak, She was orthostatic with ambulation with rapid recovery lying down. Not yet stable for discharge  Objective: Vitals:   07/05/18 1406 07/05/18 1411 07/05/18 1416 07/05/18 1424  BP: 93/72 (!) 68/37 (!) 99/49 (!) 121/56  Pulse: (!) 59 60 100 67  Resp: 15 19 16  (!) 22  Temp:      TempSrc:      SpO2: 94% 96% 93% 94%  Weight:      Height:        Intake/Output Summary (Last 24 hours) at 07/05/2018 1526 Last data filed at 07/05/2018 1512 Gross per 24 hour  Intake 720 ml  Output 1650 ml  Net -930 ml   Filed Weights   07/03/18 0537 07/04/18 0650 07/05/18 0439  Weight: 61.6 kg 60.9 kg 58 kg    Examination:  General exam: Appears calm and comfortable reports feeling weak Respiratory system: Clear to auscultation. Respiratory effort normal. Cardiovascular system: S1 & S2 heard, RRR. No JVD, murmurs, rubs, gallops or clicks. No pedal edema. Gastrointestinal system: Abdomen is nondistended, soft and nontender. Normal bowel sounds heard. Central nervous system: Alert and oriented. No focal neurological deficits. Extremities: Warm well perfused, trace edema Skin: No rashes, lesions or ulcers Psychiatry: Judgement and insight appear normal. Mood & affect appropriate.     Data Reviewed: I have personally reviewed following labs and imaging studies  CBC: Recent Labs  Lab 07/03/18 0015 07/03/18 0752 07/03/18 0914 07/04/18 0344 07/05/18 0442  WBC 9.1  --   --   --   --   HGB 6.6* 8.7* 8.9* 8.4* 8.2*  HCT 21.0* 27.0* 27.2* 25.1*  25.4*  MCV 94.2  --   --   --   --   PLT 124*  --   --   --   --    Basic Metabolic Panel: Recent Labs  Lab 07/03/18 0015  NA 127*  K 3.9  CL 97*  CO2 20*  GLUCOSE 125*  BUN 11  CREATININE 0.83  CALCIUM 8.6*   GFR: Estimated Creatinine Clearance: 43.6 mL/min (by C-G formula based on SCr of 0.83 mg/dL). Liver Function Tests: Recent Labs  Lab 07/03/18 0015  AST 32  ALT 19  ALKPHOS 63  BILITOT 1.0  PROT 6.8  ALBUMIN 3.0*   No results for input(s): LIPASE, AMYLASE in the last 168 hours. No results for input(s): AMMONIA in the last 168 hours. Coagulation Profile: No results for input(s): INR, PROTIME in the last 168 hours. Cardiac Enzymes: Recent Labs  Lab 07/03/18 0015 07/03/18 0251 07/03/18 0752 07/03/18 1404  TROPONINI 0.26* 0.28* 0.21* 0.21*   BNP (last 3 results) No results for input(s): PROBNP in the last 8760 hours. HbA1C: No results for input(s): HGBA1C in the last 72 hours. CBG: Recent Labs  Lab 07/03/18 0015  GLUCAP  127*   Lipid Profile: No results for input(s): CHOL, HDL, LDLCALC, TRIG, CHOLHDL, LDLDIRECT in the last 72 hours. Thyroid Function Tests: No results for input(s): TSH, T4TOTAL, FREET4, T3FREE, THYROIDAB in the last 72 hours. Anemia Panel: Recent Labs    07/03/18 0148  VITAMINB12 409  FOLATE 13.7  FERRITIN 16  TIBC 504*  IRON 15*  RETICCTPCT 4.3*   Sepsis Labs: No results for input(s): PROCALCITON, LATICACIDVEN in the last 168 hours.  Recent Results (from the past 240 hour(s))  SARS Coronavirus 2 [facility] order, Performed in [facility] hospital lab)     Status: None   Collection Time: 07/03/18  1:32 AM  Result Value Ref Range Status   SARS Coronavirus 2 NEGATIVE NEGATIVE Final    Comment: (NOTE) If result is NEGATIVE SARS-CoV-2 target nucleic acids are NOT DETECTED. The SARS-CoV-2 RNA is generally detectable in upper and lower  respiratory specimens during the acute phase of infection. The lowest  concentration of  SARS-CoV-2 viral copies this assay can detect is 250  copies / mL. A negative result does not preclude SARS-CoV-2 infection  and should not be used as the sole basis for treatment or other  patient management decisions.  A negative result may occur with  improper specimen collection / handling, submission of specimen other  than nasopharyngeal swab, presence of viral mutation(s) within the  areas targeted by this assay, and inadequate number of viral copies  (<250 copies / mL). A negative result must be combined with clinical  observations, patient history, and epidemiological information. If result is POSITIVE SARS-CoV-2 target nucleic acids are DETECTED. The SARS-CoV-2 RNA is generally detectable in upper and lower  respiratory specimens dur ing the acute phase of infection.  Positive  results are indicative of active infection with SARS-CoV-2.  Clinical  correlation with patient history and other diagnostic information is  necessary to determine patient infection status.  Positive results do  not rule out bacterial infection or co-infection with other viruses. If result is PRESUMPTIVE POSTIVE SARS-CoV-2 nucleic acids MAY BE PRESENT.   A presumptive positive result was obtained on the submitted specimen  and confirmed on repeat testing.  While 2019 novel coronavirus  (SARS-CoV-2) nucleic acids may be present in the submitted sample  additional confirmatory testing may be necessary for epidemiological  and / or clinical management purposes  to differentiate between  SARS-CoV-2 and other Sarbecovirus currently known to infect humans.  If clinically indicated additional testing with an alternate test  methodology [phone]) is advised. The SARS-CoV-2 RNA is generally  detectable in upper and lower respiratory sp ecimens during the acute  phase of infection. The expected result is Negative. Fact Sheet for Patients:  StrictlyIdeas.no Fact Sheet for Healthcare  Providers: BankingDealers.co.za This test is not yet approved or cleared by the [place] FDA and has been authorized for detection and/or diagnosis of SARS-CoV-2 by FDA under an Emergency Use Authorization (EUA).  This EUA will remain in effect (meaning this test can be used) for the duration of the COVID-19 declaration under Section 564(b)(1) of the Act, 21 U.S.C. section 360bbb-3(b)(1), unless the authorization is terminated or revoked sooner. Performed at [facility], [place] [address] [place], [place] [address]          Radiology Studies: Dg Chest Port 1 View  Result Date: 07/04/2018 CLINICAL DATA:  Edema. EXAM: PORTABLE CHEST 1 VIEW COMPARISON:  Chest x-ray from yesterday. FINDINGS: Stable cardiomediastinal silhouette status post CABG. Unchanged pulmonary vascular congestion and mild  interstitial pulmonary edema. Persistent left basilar airspace disease. Unchanged small bilateral pleural effusions. No pneumothorax. No acute osseous abnormality. IMPRESSION: 1. Unchanged mild pulmonary interstitial edema and small bilateral pleural effusions. 2. Persistent left lower lobe atelectasis versus infiltrate. Electronically Signed   By: Titus Dubin M.D.   On: 07/04/2018 07:51        Scheduled Meds:  sodium chloride   Intravenous Once   atorvastatin  40 mg Oral QHS   isosorbide mononitrate  120 mg Oral Daily   metoprolol tartrate  37.5-50 mg Oral BID   pantoprazole  40 mg Oral Daily   ranolazine  1,000 mg Oral BID   trimethoprim  100 mg Oral Daily   Continuous Infusions:   LOS: 2 days    Time spent: 35 min    Nicolette Bang, MD [facility]  If 7PM-7AM, please contact night-coverage  07/05/2018, 3:26 PM

## 2018-07-05 NOTE — Progress Notes (Addendum)
Patient ambulated about 10 feet on room air and maintained oxygen saturations >92% at all times. Patient required +2 staff assist and used a walker. Patient complained of dizziness and slight nausea. Patient was found to be orthostatic; BP 93/72 sitting EOB prior to ambulating; dropped to 68/37 immediately post ambulation; returned to 99/49 after patient returned to a lying position and 121/56 after 5 minutes of lying down. Patient states she feels very weak and sleepy today. Will update Dr. Wyonia Hough.

## 2018-07-05 NOTE — Progress Notes (Signed)
Weaned patient to room air; patient has maintained oxygen saturations >92% on room air since about 1100. Patient still has rhonchi throughout and has a very congested but nonproductive cough.

## 2018-07-06 ENCOUNTER — Inpatient Hospital Stay (HOSPITAL_COMMUNITY): Payer: Medicare Other

## 2018-07-06 DIAGNOSIS — I209 Angina pectoris, unspecified: Secondary | ICD-10-CM

## 2018-07-06 DIAGNOSIS — J810 Acute pulmonary edema: Secondary | ICD-10-CM

## 2018-07-06 LAB — HEMOGLOBIN AND HEMATOCRIT, BLOOD
HCT: 27 % — ABNORMAL LOW (ref 36.0–46.0)
Hemoglobin: 8.8 g/dL — ABNORMAL LOW (ref 12.0–15.0)

## 2018-07-06 MED ORDER — SODIUM CHLORIDE 0.9 % IV BOLUS
250.0000 mL | Freq: Once | INTRAVENOUS | Status: AC
  Administered 2018-07-06: 08:00:00 250 mL via INTRAVENOUS

## 2018-07-06 MED ORDER — METOPROLOL TARTRATE 25 MG PO TABS
25.0000 mg | ORAL_TABLET | Freq: Two times a day (BID) | ORAL | Status: DC
  Administered 2018-07-06 – 2018-07-07 (×3): 25 mg via ORAL
  Filled 2018-07-06 (×3): qty 1

## 2018-07-06 NOTE — Progress Notes (Signed)
VAST RN consulted for PIV. Arrived at pt's bedside to find her sitting in chair. Spoke with pt's nurse, Colletta [place]. She will place IVT consult once pt is back in bed and in need of IV.

## 2018-07-06 NOTE — Progress Notes (Signed)
MEWS score falsely elevated due to orthostatic BP; Dr. Wyonia Hough is aware; this is not an acute change.

## 2018-07-06 NOTE — Progress Notes (Addendum)
Physical Therapy Treatment Patient Details Name: [name] MRN: [id number] DOB: Nov 04, 1934 Today's Date: 07/06/2018    History of Present Illness 84 y.o. female with medical history significant of CAD s/p CABG, chronic chest pain, all grafts of CABG except LIMA to LAD are down apparently, A.Fib on eliquis, HTN. Presents to ED with worsening CP with exertion. generalized weakness, nausea and lightheadedness. Currently being treated for systomatic anemia, pulmonary edema and Afib.    PT Comments    Pt pleasant with flat affect reporting continued fatigue and willing to attempt mobility. Pt remains limited by orthostatic BP and unable to progress beyond 20' ambulation with 2 person assist safely. Pt encouraged to be OOB throughout the day and perform LE exercises as able to tolerate. Will continue to follow to progress toward function for home. At this time pt will need to increase activity and endurance to be able to tolerate return home vs possible rehab center at current function.   Supine BP 123/52 (72), HR 64 Sitting BP 110/52 (70), HR 64 Sitting after 4' of gait 90/48 (63), HR 60 Sitting after 20' of gait 80/34 (49), HR 61 Pt in chair with feet elevated after 2 min end of gait, 107/44 (64), HR 611 SpO2 95-98% on Ra throughout session   Follow Up Recommendations  Home health PT;Supervision/Assistance - 24 hour;SNF     Equipment Recommendations  None recommended by PT    Recommendations for Other Services       Precautions / Restrictions Precautions Precautions: Fall Precaution Comments: watch BP    Mobility  Bed Mobility Overal bed mobility: Needs Assistance Bed Mobility: Supine to Sit     Supine to sit: Min guard;HOB elevated     General bed mobility comments: HOB 20 degrees with increased time and use of rail   Transfers Overall transfer level: Needs assistance   Transfers: Sit to/from Stand Sit to Stand: Min assist         General transfer comment:  cues for hand placement with very light assist to stand from bed and from chair x 2 trials  Ambulation/Gait Ambulation/Gait assistance: Min assist;+2 safety/equipment Gait Distance (Feet): 20 Feet Assistive device: Rolling walker (2 wheeled) Gait Pattern/deviations: Step-through pattern;Decreased stride length;Trunk flexed;Narrow base of support   Gait velocity interpretation: <1.8 ft/sec, indicate of risk for recurrent falls General Gait Details: min assist for balance and to direct RW with close chair follow   Stairs             Wheelchair Mobility    Modified Rankin (Stroke Patients Only)       Balance Overall balance assessment: Needs assistance Sitting-balance support: No upper extremity supported;Feet supported Sitting balance-Leahy Scale: Fair     Standing balance support: Bilateral upper extremity supported Standing balance-Leahy Scale: Poor                              Cognition Arousal/Alertness: Awake/alert Behavior During Therapy: WFL for tasks assessed/performed Overall Cognitive Status: Within Functional Limits for tasks assessed                                        Exercises      General Comments        Pertinent Vitals/Pain Pain Assessment: No/denies pain    Home Living  Prior Function            PT Goals (current goals can now be found in the care plan section) Progress towards PT goals: Progressing toward goals    Frequency    Min 4X/week      PT Plan Current plan remains appropriate    Co-evaluation              AM-PAC PT "6 Clicks" Mobility   Outcome Measure  Help needed turning from your back to your side while in a flat bed without using bedrails?: A Little Help needed moving from lying on your back to sitting on the side of a flat bed without using bedrails?: A Little Help needed moving to and from a bed to a chair (including a wheelchair)?: A  Little Help needed standing up from a chair using your arms (e.g., wheelchair or bedside chair)?: A Little Help needed to walk in hospital room?: A Little Help needed climbing 3-5 steps with a railing? : A Lot 6 Click Score: 17    End of Session Equipment Utilized During Treatment: Gait belt Activity Tolerance: Patient limited by fatigue Patient left: in chair;with call bell/phone within reach;with chair alarm set Nurse Communication: Mobility status PT Visit Diagnosis: Unsteadiness on feet (R26.81);Other abnormalities of gait and mobility (R26.89);Muscle weakness (generalized) (M62.81);Difficulty in walking, not elsewhere classified (R26.2);History of falling (Z91.81);Dizziness and giddiness (R42)     Time: 2103-1281 PT Time Calculation (min) (ACUTE ONLY): 30 min  Charges:  $Gait Training: 8-22 mins $Therapeutic Activity: 8-22 mins                     Ata Pecha Pam Drown, PT Acute Rehabilitation Services Pager: [phone] Office: [place] 07/06/2018, 12:44 PM

## 2018-07-06 NOTE — Progress Notes (Signed)
Spoke with Dr. Gwenlyn Found at bedside regarding patient's metoprolol dose since patient has been having issues with orthostatic BP. Per Dr. Gwenlyn Found metoprolol dose can be decreased to 25 mg bid.

## 2018-07-06 NOTE — Progress Notes (Signed)
Progress Note  Patient Name: [name] Date of Encounter: 07/06/2018  Primary Cardiologist: Shelva Majestic, MD   Subjective   Seems to be a little better this morning.  Denies chest pain.  Inpatient Medications    Scheduled Meds: . sodium chloride   Intravenous Once  . atorvastatin  40 mg Oral QHS  . isosorbide mononitrate  120 mg Oral Daily  . metoprolol tartrate  37.5-50 mg Oral BID  . pantoprazole  40 mg Oral Daily  . ranolazine  1,000 mg Oral BID  . trimethoprim  100 mg Oral Daily   Continuous Infusions:  PRN Meds: acetaminophen **OR** acetaminophen, ondansetron **OR** ondansetron (ZOFRAN) IV   Vital Signs    Vitals:   07/06/18 0743 07/06/18 0745 07/06/18 0818 07/06/18 0822  BP: 99/63 (!) 63/47 (!) 129/55 115/73  Pulse: 62  62 62  Resp: 19 (!) 21 17 18   Temp:      TempSrc:      SpO2: 97% 100% 100% 99%  Weight:      Height:        Intake/Output Summary (Last 24 hours) at 07/06/2018 0825 Last data filed at 07/05/2018 2336 Gross per 24 hour  Intake 480 ml  Output 1450 ml  Net -970 ml   Last 3 Weights 07/06/2018 07/05/2018 07/04/2018  Weight (lbs) 131 lb 6.3 oz 127 lb 12.8 oz 134 lb 4.2 oz  Weight (kg) 59.6 kg 57.97 kg 60.9 kg      Telemetry    Sinus rhythm- Personally Reviewed  ECG    None today- Personally Reviewed  Physical Exam   GEN: No acute distress.   Neck: No JVD Cardiac: RRR, no murmurs, rubs, or gallops.  Respiratory: Clear to auscultation bilaterally. GI: Soft, nontender, non-distended  MS: No edema; No deformity. Neuro:  Nonfocal  Psych: Normal affect   Labs    Chemistry Recent Labs  Lab 07/03/18 0015  NA 127*  K 3.9  CL 97*  CO2 20*  GLUCOSE 125*  BUN 11  CREATININE 0.83  CALCIUM 8.6*  PROT 6.8  ALBUMIN 3.0*  AST 32  ALT 19  ALKPHOS 63  BILITOT 1.0  GFRNONAA >60  GFRAA >60  ANIONGAP 10     Hematology Recent Labs  Lab 07/03/18 0015 07/03/18 0148  07/04/18 0344 07/05/18 0442 07/06/18 0341  WBC 9.1  --    --   --   --   --   RBC 2.23* 2.26*  --   --   --   --   HGB 6.6*  --    < > 8.4* 8.2* 8.8*  HCT 21.0*  --    < > 25.1* 25.4* 27.0*  MCV 94.2  --   --   --   --   --   MCH 29.6  --   --   --   --   --   MCHC 31.4  --   --   --   --   --   RDW 14.9  --   --   --   --   --   PLT 124*  --   --   --   --   --    < > = values in this interval not displayed.    Cardiac Enzymes Recent Labs  Lab 07/03/18 0015 07/03/18 0251 07/03/18 0752 07/03/18 1404  TROPONINI 0.26* 0.28* 0.21* 0.21*   No results for input(s): TROPIPOC in the last 168 hours.   BNP  Recent Labs  Lab 07/03/18 0914  BNP 9,563.8*     DDimer No results for input(s): DDIMER in the last 168 hours.   Radiology    No results found.  Cardiac Studies   None this admission   Patient Profile     84 y.o. female with a hx of CAD s/p CABG, chronic chest pain, HLD, A. Fib on eliquis and HTN presented for the evaluation of chest pain found to be anemic at hemoglobin of 6.6.  COVID negative.   Assessment & Plan    1. Chest pain/ NSTEMI - Flat troponin trend. 0.26>>0.28>>0.21>>0.21. Felt better after transfusion. Likely demand ischemia in setting of severe anemia. Continue Statin, Imdur 120mg  daily, BB, and Renexa.   2. Symptomatic anemia - S/p 2 units of PRBCs. Hx of recurrent epistasis. Pending hemoccult.  - Hgb improved to 8.9 post transfusion, stable at 8.8 today.  3. PAF - Anticoagulation on hold due to profound anemia. Resume when okay with primary.  Continue BB.  4. HTN - BP stable on current medication   [facility] will sign off.   Medication Recommendations: We will decrease metoprolol to 25 twice daily Other recommendations (labs, testing, etc): None Follow up as an outpatient: Follow-up with Dr. Martinique as an outpatient in 3 to 4 weeks.  Would also arrange TOC 7 with 1 of our midlevel providers  For questions or updates, please contact [place] Please consult [email or web address] for contact info  under        Signed, Leanor Kail, PA  07/06/2018, 8:25 AM    Agree with note by Robbie Lis PA-C  Agree the chest pain and flat troponins were probably a type II demand ischemia event from severe anemia.  Status post transfusion of 2 units of packed red blood cells with Lasix in between.  She is -2.4 L.  She is not a candidate for oral anticoagulation with her PAF.  Chest x-ray is improving.  Her BNP was 1800.  She probably should be discharged on a low-dose diuretic.  Her lungs are clear anteriorly although she apparently she did have diffuse rhonchi yesterday.  She has no peripheral edema.  I do not think she will do well at home.  She lives with her husband.  I suspect she will need interim rehab facility to get stronger prior to transitioning to home environment.  Will sign off for now.  Please call us if we can be of further assistance.   Lorretta Harp, M.D., [place], [facility], [place] [address] [place], [place]  [address]  [phone] 07/06/2018 8:35 AM  .

## 2018-07-06 NOTE — TOC Progression Note (Signed)
Transition of Care [facility]) - Progression Note    Patient Details  Name: [name] MRN: [id number] Date of Birth: April 02, 1934  Transition of Care [facility]) CM/SW Contact  [name], Marilyn Cornfield, RN Phone Number: 07/06/2018, 2:55 PM  Clinical Narrative:  CM awaited re-consult for PT- recommendation for home 24 hour supervision/SNF- Pt is declining SNF at this time. CM did ask to call husband [name]. Husband felt like patient would not do well in a facility and wanted her to come home. Pt/husband is agreeable to [facility] services. Services Maxed out and choice offered. [facility] [place] referral sent to [facility] and [facility] to begin within 24-48 hours post transition home. CM will continue to monitor for additional transition of care needs.      Expected Discharge Plan: [place] Barriers to Discharge: Continued Medical Work up(aBlood Pressure low with ambulation)  Expected Discharge Plan and Services Expected Discharge Plan: [place] In-house Referral: NA Discharge Planning Services: CM Consult Post Acute Care Choice: [place] arrangements for the past 2 months: Single Family Home                           HH Arranged: RN, Disease Management, PT, OT, Nurse's Aide, Social Work [employer] Agency: [facility] Date [place]: 07/06/18 Time [place]: [place] Representative spoke with at [place]: [place] ([place]) Interventions    Readmission Risk Interventions No flowsheet data found.

## 2018-07-06 NOTE — Progress Notes (Signed)
PROGRESS NOTE    SHEKITA BOYDEN  QQI:[id number] DOB: 02-13-1935 DOA: 07/02/2018 PCP: Leanna Battles, MD   Brief Narrative:  Per HPI: MARQUITA LIAS a 84 y.o.femalewith medical history significant ofCAD s/p CABG, chronic chest pain, all grafts of CABG except LIMA to LAD are down apparently, A.Fib on eliquis, HTN. Patient presents to the ED with worsening CP with exertion for some time recently. Saw Dr. Claiborne Billings for this a couple of days ago in Cards clinic. At that visit metoprolol and Ranexa were increased to help with CP. Today she presents to the ED with worsening generalized weakness, nausea, and lightheadedness.  ED Course:HGB 6.6, Trop 0.26. CXR shows pulmonary vascular congestion. Denies hematochezia, melena, hemoptysis. After extensive questioning, patient reveals she has had frequent nose bleeds over the past couple of months. Hemoccult ordered and pending. EDP gave 40mg  lasix IV x1 and ordered 2u PRBC transfusion. Cards consult in chart.  While in the hospital patient was seen by cardiology with medication adjustment.  Patient is remained orthostatic received a small fluid bolus May 5, decrease patient's antihypertensives as her standing blood pressures were in the 60s with patient being very symptomatic.  This patient remained the hospital additional day for washout and adjustment of medications hopefully stable for discharge tomorrow  Assessment & Plan:   Principal Problem:   Symptomatic anemia Active Problems:   Angina pectoris (HCC)   Atrial fibrillation (HCC)   HTN (hypertension)   CAD (coronary artery disease)   Long term (current) use of anticoagulants   Recurrent UTI   Pulmonary edema   Recurrent epistaxis   Pressure injury of skin   1. Symptomatic anemia -possibly due to recurrent episodes epistaxis (none at the moment though) 1. 2u PRBCtransfusedHGB stable 8.8 2. Hemoccult ordered andstillpending -I anticipate this will be positive given  her epistaxis and swallowing of blood 3. Anemia pnlhas come back suggestive of iron deficiency 4. Soft diet 5. Cont home PPI 6. Will hold off onconsult GI,ENT as outpt as no epistaxis since in hospital 2. Pulmonary edema / angina pectoris / elevated troponin - in setting of CAD 1. Suspicious that her NSTEMI like presentation today is type 2 (demand ischemia) and secondary to anemia 2. Card adjusted patient's antiarrhythmics today 3. Lasix 40mg  IV x1on admission, repeat CXRshws edema, lasix 40 mg x 1 5/3, 5/4 but pt now slightly orthostatic, hold lasix 4. Strict intake and output 5. Tele monitor 3. A.Fib - 1. Cont metoprolol for rate control although decreased dosing secondary to orthostatic hypotension and block heart rate response 2. Holding eliquis2/2 symptomatic anemia, patient without any additional epistaxis would anticipate should be able to restart after holding 7 to 14 days 4. HTN - 1. Holding norvasc and ARB for the moment 2. Cont metoprolol at reduced dose 5/5, imdur 5. H/o Recurrent UTIs - 1. Cont chronic TMP  DVT prophylaxis: SCD/Compression stockings  Code Status: Full    Code Status Orders  (From admission, onward)         Start     Ordered   07/03/18 0240  Full code  Continuous     07/03/18 0241        Code Status History    This patient has a current code status but no historical code status.     Family Communication: Tried calling spouse Barnabas Lister, No answer Disposition Plan:   Pt will remain inpatient 2/2 orthostatic hypotension and weakness, medication adjustmentand continued subspecialty evaluation by cardiology.Without these treatments, risks include hemodynamic collapse and  worsening cardiac function Consults called: None Admission status: Inpatient   Consultants:   cardiology  Procedures:  Dg Chest 2 View  Result Date: 07/03/2018 CLINICAL DATA:  Chest pain, shortness of breath EXAM: CHEST - 2 VIEW COMPARISON:  None. FINDINGS: Prior median  sternotomy and CABG. Low lung volumes. Mild vascular congestion. Bibasilar opacities with small effusions. No acute bony abnormality. IMPRESSION: Low lung volumes with bibasilar opacities, left greater than right, atelectasis versus infiltrates. Small bilateral effusions. Mild vascular congestion. Electronically Signed   By: [name] M.D.   On: 07/03/2018 01:02   Dg Chest Port 1 View  Result Date: 07/06/2018 CLINICAL DATA:  Cough.  Edema EXAM: PORTABLE CHEST 1 VIEW COMPARISON:  Two days ago FINDINGS: Cardiomegaly. There has been CABG. Asymmetric lung opacity with mild improvement in aeration at the left base where the diaphragm is becoming more apparent. No effusion or pneumothorax. IMPRESSION: Mild improvement in aeration.  No new abnormality. Electronically Signed   By: Monte Fantasia M.D.   On: 07/06/2018 08:33   Dg Chest Port 1 View  Result Date: 07/04/2018 CLINICAL DATA:  Edema. EXAM: PORTABLE CHEST 1 VIEW COMPARISON:  Chest x-ray from yesterday. FINDINGS: Stable cardiomediastinal silhouette status post CABG. Unchanged pulmonary vascular congestion and mild interstitial pulmonary edema. Persistent left basilar airspace disease. Unchanged small bilateral pleural effusions. No pneumothorax. No acute osseous abnormality. IMPRESSION: 1. Unchanged mild pulmonary interstitial edema and small bilateral pleural effusions. 2. Persistent left lower lobe atelectasis versus infiltrate. Electronically Signed   By: Titus Dubin M.D.   On: 07/04/2018 07:51     Antimicrobials:   none    Subjective: Patient was very orthostatic this morning with standing blood pressures in the 60s. Patient reported significant symptoms with weakness and dizziness Received 250 cc bolus and adjustment of medications  Objective: Vitals:   07/06/18 0745 07/06/18 0818 07/06/18 0822 07/06/18 0932  BP: (!) 63/47 (!) 129/55 115/73   Pulse:  62 62 66  Resp: (!) 21 17 18    Temp:      TempSrc:      SpO2: 100% 100% 99%    Weight:      Height:        Intake/Output Summary (Last 24 hours) at 07/06/2018 1344 Last data filed at 07/05/2018 2336 Gross per 24 hour  Intake 240 ml  Output 650 ml  Net -410 ml   Filed Weights   07/04/18 0650 07/05/18 0439 07/06/18 1696  Weight: 60.9 kg 58 kg 59.6 kg    Examination:  General exam: Appears calm, weaker today, dizzy with standing Respiratory system: Clear to auscultation. Significant improvement in lungs Cardiovascular system: S1 & S2 heard, RRR. No JVD, murmurs, rubs, gallops or clicks. No pedal edema. Gastrointestinal system: Abdomen is nondistended, soft and nontender. Normal bowel sounds heard. Central nervous system: Alert and oriented. No focal neurological deficits. Extremities: WWP, no edema Skin: No rashes, lesions or ulcers Psychiatry: Judgement and insight appear normal. Mood & affect appropriate.     Data Reviewed: I have personally reviewed following labs and imaging studies  CBC: Recent Labs  Lab 07/03/18 0015 07/03/18 0752 07/03/18 0914 07/04/18 0344 07/05/18 0442 07/06/18 0341  WBC 9.1  --   --   --   --   --   HGB 6.6* 8.7* 8.9* 8.4* 8.2* 8.8*  HCT 21.0* 27.0* 27.2* 25.1* 25.4* 27.0*  MCV 94.2  --   --   --   --   --   PLT 124*  --   --   --   --   --  Basic Metabolic Panel: Recent Labs  Lab 07/03/18 0015  NA 127*  K 3.9  CL 97*  CO2 20*  GLUCOSE 125*  BUN 11  CREATININE 0.83  CALCIUM 8.6*   GFR: Estimated Creatinine Clearance: 44.1 mL/min (by C-G formula based on SCr of 0.83 mg/dL). Liver Function Tests: Recent Labs  Lab 07/03/18 0015  AST 32  ALT 19  ALKPHOS 63  BILITOT 1.0  PROT 6.8  ALBUMIN 3.0*   No results for input(s): LIPASE, AMYLASE in the last 168 hours. No results for input(s): AMMONIA in the last 168 hours. Coagulation Profile: No results for input(s): INR, PROTIME in the last 168 hours. Cardiac Enzymes: Recent Labs  Lab 07/03/18 0015 07/03/18 0251 07/03/18 0752 07/03/18 1404  TROPONINI  0.26* 0.28* 0.21* 0.21*   BNP (last 3 results) No results for input(s): PROBNP in the last 8760 hours. HbA1C: No results for input(s): HGBA1C in the last 72 hours. CBG: Recent Labs  Lab 07/03/18 0015  GLUCAP 127*   Lipid Profile: No results for input(s): CHOL, HDL, LDLCALC, TRIG, CHOLHDL, LDLDIRECT in the last 72 hours. Thyroid Function Tests: No results for input(s): TSH, T4TOTAL, FREET4, T3FREE, THYROIDAB in the last 72 hours. Anemia Panel: No results for input(s): VITAMINB12, FOLATE, FERRITIN, TIBC, IRON, RETICCTPCT in the last 72 hours. Sepsis Labs: No results for input(s): PROCALCITON, LATICACIDVEN in the last 168 hours.  Recent Results (from the past 240 hour(s))  SARS Coronavirus 2 [facility] order, Performed in [facility] hospital lab)     Status: None   Collection Time: 07/03/18  1:32 AM  Result Value Ref Range Status   SARS Coronavirus 2 NEGATIVE NEGATIVE Final    Comment: (NOTE) If result is NEGATIVE SARS-CoV-2 target nucleic acids are NOT DETECTED. The SARS-CoV-2 RNA is generally detectable in upper and lower  respiratory specimens during the acute phase of infection. The lowest  concentration of SARS-CoV-2 viral copies this assay can detect is 250  copies / mL. A negative result does not preclude SARS-CoV-2 infection  and should not be used as the sole basis for treatment or other  patient management decisions.  A negative result may occur with  improper specimen collection / handling, submission of specimen other  than nasopharyngeal swab, presence of viral mutation(s) within the  areas targeted by this assay, and inadequate number of viral copies  (<250 copies / mL). A negative result must be combined with clinical  observations, patient history, and epidemiological information. If result is POSITIVE SARS-CoV-2 target nucleic acids are DETECTED. The SARS-CoV-2 RNA is generally detectable in upper and lower  respiratory specimens dur ing the acute phase of  infection.  Positive  results are indicative of active infection with SARS-CoV-2.  Clinical  correlation with patient history and other diagnostic information is  necessary to determine patient infection status.  Positive results do  not rule out bacterial infection or co-infection with other viruses. If result is PRESUMPTIVE POSTIVE SARS-CoV-2 nucleic acids MAY BE PRESENT.   A presumptive positive result was obtained on the submitted specimen  and confirmed on repeat testing.  While 2019 novel coronavirus  (SARS-CoV-2) nucleic acids may be present in the submitted sample  additional confirmatory testing may be necessary for epidemiological  and / or clinical management purposes  to differentiate between  SARS-CoV-2 and other Sarbecovirus currently known to infect humans.  If clinically indicated additional testing with an alternate test  methodology [phone]) is advised. The SARS-CoV-2 RNA is generally  detectable in upper and lower  respiratory sp ecimens during the acute  phase of infection. The expected result is Negative. Fact Sheet for Patients:  StrictlyIdeas.no Fact Sheet for Healthcare Providers: BankingDealers.co.za This test is not yet approved or cleared by the [place] FDA and has been authorized for detection and/or diagnosis of SARS-CoV-2 by FDA under an Emergency Use Authorization (EUA).  This EUA will remain in effect (meaning this test can be used) for the duration of the COVID-19 declaration under Section 564(b)(1) of the Act, 21 U.S.C. section 360bbb-3(b)(1), unless the authorization is terminated or revoked sooner. Performed at [facility], [place] [address], [place], [place] [address]          Radiology Studies: Dg Chest Port 1 View  Result Date: 07/06/2018 CLINICAL DATA:  Cough.  Edema EXAM: PORTABLE CHEST 1 VIEW COMPARISON:  Two days ago FINDINGS: Cardiomegaly. There has been CABG. Asymmetric lung  opacity with mild improvement in aeration at the left base where the diaphragm is becoming more apparent. No effusion or pneumothorax. IMPRESSION: Mild improvement in aeration.  No new abnormality. Electronically Signed   By: Monte Fantasia M.D.   On: 07/06/2018 08:33        Scheduled Meds: . sodium chloride   Intravenous Once  . atorvastatin  40 mg Oral QHS  . isosorbide mononitrate  120 mg Oral Daily  . metoprolol tartrate  25 mg Oral BID  . pantoprazole  40 mg Oral Daily  . ranolazine  1,000 mg Oral BID  . trimethoprim  100 mg Oral Daily   Continuous Infusions:   LOS: 3 days    Time spent: 35 min    Nicolette Bang, MD [facility]  If 7PM-7AM, please contact night-coverage  07/06/2018, 1:44 PM

## 2018-07-07 LAB — CBC
HCT: 25.6 % — ABNORMAL LOW (ref 36.0–46.0)
Hemoglobin: 8.1 g/dL — ABNORMAL LOW (ref 12.0–15.0)
MCH: 28.4 pg (ref 26.0–34.0)
MCHC: 31.6 g/dL (ref 30.0–36.0)
MCV: 89.8 fL (ref 80.0–100.0)
Platelets: 127 10*3/uL — ABNORMAL LOW (ref 150–400)
RBC: 2.85 MIL/uL — ABNORMAL LOW (ref 3.87–5.11)
RDW: 15.5 % (ref 11.5–15.5)
WBC: 7.6 10*3/uL (ref 4.0–10.5)
nRBC: 0 % (ref 0.0–0.2)

## 2018-07-07 LAB — BPAM RBC
Blood Product Expiration Date: 202005062359
Blood Product Expiration Date: 202005062359
Blood Product Expiration Date: 202005192359
ISSUE DATE / TIME: 202005020331
ISSUE DATE / TIME: 202005031707
Unit Type and Rh: 5100
Unit Type and Rh: 5100
Unit Type and Rh: 5100

## 2018-07-07 LAB — TYPE AND SCREEN
ABO/RH(D): O POS
Antibody Screen: NEGATIVE
Unit division: 0
Unit division: 0
Unit division: 0

## 2018-07-07 LAB — BASIC METABOLIC PANEL
Anion gap: 7 (ref 5–15)
BUN: 12 mg/dL (ref 8–23)
CO2: 26 mmol/L (ref 22–32)
Calcium: 8.9 mg/dL (ref 8.9–10.3)
Chloride: 99 mmol/L (ref 98–111)
Creatinine, Ser: 0.72 mg/dL (ref 0.44–1.00)
GFR calc Af Amer: >60 mL/min (ref >60)
GFR calc non Af Amer: >60 mL/min (ref >60)
Glucose, Bld: 103 mg/dL — ABNORMAL HIGH (ref 70–99)
Potassium: 3.8 mmol/L (ref 3.5–5.1)
Sodium: 132 mmol/L — ABNORMAL LOW (ref 135–145)

## 2018-07-07 MED ORDER — BENZONATATE 100 MG PO CAPS: 100.0000 mg | ORAL_CAPSULE | Freq: Three times a day (TID) | ORAL | 0 refills | Status: AC | PRN

## 2018-07-07 MED ORDER — GUAIFENESIN-DM 100-10 MG/5ML PO SYRP: 5.0000 mL | ORAL_SOLUTION | ORAL | 0 refills | Status: AC | PRN

## 2018-07-07 MED ORDER — FUROSEMIDE 20 MG PO TABS: 20.0000 mg | ORAL_TABLET | Freq: Every day | ORAL | 11 refills | Status: AC | PRN

## 2018-07-07 MED ORDER — GUAIFENESIN ER 600 MG PO TB12
600.0000 mg | ORAL_TABLET | Freq: Two times a day (BID) | ORAL | Status: DC
  Administered 2018-07-07: 600 mg via ORAL
  Filled 2018-07-07: qty 1

## 2018-07-07 MED ORDER — METOPROLOL TARTRATE 25 MG PO TABS: 25.0000 mg | ORAL_TABLET | Freq: Two times a day (BID) | ORAL | 3 refills | Status: AC

## 2018-07-07 MED ORDER — BENZONATATE 100 MG PO CAPS
100.0000 mg | ORAL_CAPSULE | Freq: Three times a day (TID) | ORAL | Status: DC
  Administered 2018-07-07: 100 mg via ORAL
  Filled 2018-07-07: qty 1

## 2018-07-07 MED ORDER — FERROUS GLUCONATE 324 (38 FE) MG PO TABS: 324.0000 mg | ORAL_TABLET | Freq: Every day | ORAL | 3 refills | Status: AC

## 2018-07-07 MED ORDER — OMEPRAZOLE 40 MG PO CPDR: 40.0000 mg | DELAYED_RELEASE_CAPSULE | Freq: Every day | ORAL | 3 refills | Status: AC

## 2018-07-07 MED ORDER — SENNOSIDES-DOCUSATE SODIUM 8.6-50 MG PO TABS: 1.0000 | ORAL_TABLET | Freq: Every evening | ORAL | 0 refills | Status: AC | PRN

## 2018-07-07 MED ORDER — GUAIFENESIN ER 600 MG PO TB12: 600.0000 mg | ORAL_TABLET | Freq: Two times a day (BID) | ORAL | 0 refills | Status: AC

## 2018-07-07 MED ORDER — APIXABAN 5 MG PO TABS: 5.0000 mg | ORAL_TABLET | Freq: Two times a day (BID) | ORAL | 6 refills | Status: AC

## 2018-07-07 MED ORDER — GUAIFENESIN-DM 100-10 MG/5ML PO SYRP: 5.0000 mL | ORAL_SOLUTION | ORAL | Status: DC | PRN

## 2018-07-07 NOTE — Evaluation (Signed)
Occupational Therapy Evaluation Patient Details Name: [name] MRN: [id number] DOB: February 08, 1935 Today's Date: 07/07/2018    History of Present Illness 84 y.o. female with medical history significant of CAD s/p CABG, chronic chest pain, all grafts of CABG except LIMA to LAD are down apparently, A.Fib on eliquis, HTN. Presents to ED with worsening CP with exertion. generalized weakness, nausea and lightheadedness. Currently being treated for systomatic anemia, pulmonary edema and Afib.   Clinical Impression   Pt was admitted for the above. She plans d/c home with husband today. Pt has decreased activity tolerance and will benefit from continued OT.  She currently needs min guard to min A for mobility, and mod/max A for LB adls/hygiene.  Pt does not want a 3:1 commode, but she does get up in night for toileting. Recommend that [facility] further assess this.    Follow Up Recommendations  Home health OT    Equipment Recommendations  (does not want DME;  HHOT to assess 3:1) Pt does not want a 3:1 commode at this time. She has decreased activity tolerance and would benefit   Recommendations for Other Services       Precautions / Restrictions Precautions Precautions: Fall Precaution Comments: watch BP Restrictions Weight Bearing Restrictions: No      Mobility Bed Mobility Overal bed mobility: Modified Independent Bed Mobility: Sit to Supine     Supine to sit: Min assist Sit to supine: Modified independent (Device/Increase time)   General bed mobility comments: min A for trunk to sit up. No assist for back to bed  Transfers Overall transfer level: Needs assistance Equipment used: Rolling walker (2 wheeled) Transfers: Sit to/from Stand Sit to Stand: Min guard         General transfer comment: for safety    Balance                                           ADL either performed or assessed with clinical judgement   ADL Overall ADL's : Needs  assistance/impaired Eating/Feeding: Independent   Grooming: Set up;Oral care;Brushing hair   Upper Body Bathing: Set up   Lower Body Bathing: Moderate assistance   Upper Body Dressing : Minimal assistance   Lower Body Dressing: Maximal assistance                 General ADL Comments: sat eob and performed grooming tasks. Pt's purewick was not a good seal. Stood and performed hygiene, changed chux and replaced gown. Pt wears pads in underwear at home due to incontinence. Pt fatiques esily. Talked to her about balancing activities and breaking activities down     Vision         Perception     Praxis      Pertinent Vitals/Pain Pain Assessment: Faces Faces Pain Scale: Hurts little more Pain Location: neck Pain Descriptors / Indicators: Aching Pain Intervention(s): Limited activity within patient's tolerance;Monitored during session;Repositioned;Heat applied     Hand Dominance     Extremity/Trunk Assessment Upper Extremity Assessment Upper Extremity Assessment: Generalized weakness           Communication Communication Communication: No difficulties   Cognition Arousal/Alertness: Awake/alert Behavior During Therapy: WFL for tasks assessed/performed Overall Cognitive Status: Within Functional Limits for tasks assessed  General Comments       Exercises General Exercises - Lower Extremity Ankle Circles/Pumps: AROM;Strengthening;Both;10 reps;Seated(with leg rest up) Quad Sets: AROM;Strengthening;Both;10 reps;Seated(with leg rest up) Long Arc Quad: AROM;Strengthening;Both;10 reps;Seated   Shoulder Instructions      Home Living Family/patient expects to be discharged to:: Private residence Living Arrangements: Spouse/significant other Available Help at Discharge: Family;Available 24 hours/day Type of Home: House       Home Layout: One level     Bathroom Shower/Tub: Engineer, site: Handicapped height     Home Equipment: Environmental consultant - 2 wheels;Cane - single point;Tub bench;Grab bars - tub/shower          Prior Functioning/Environment      ADL's / Homemaking Assistance Needed: limited cooking and cleaning, able to perform LE washing and dressing with difficulty   Comments: husband cooks/cleans        OT Problem List: Decreased strength;Decreased activity tolerance;Impaired balance (sitting and/or standing);Decreased knowledge of use of DME or AE;Pain      OT Treatment/Interventions:      OT Goals(Current goals can be found in the care plan section) Acute Rehab OT Goals Patient Stated Goal: go home today OT Goal Formulation: All assessment and education complete, DC therapy  OT Frequency:     Barriers to D/C:            Co-evaluation              AM-PAC OT "6 Clicks" Daily Activity     Outcome Measure Help from another person eating meals?: None Help from another person taking care of personal grooming?: A Little Help from another person toileting, which includes using toliet, bedpan, or urinal?: A Lot Help from another person bathing (including washing, rinsing, drying)?: A Lot Help from another person to put on and taking off regular upper body clothing?: A Little Help from another person to put on and taking off regular lower body clothing?: A Lot 6 Click Score: 16   End of Session    Activity Tolerance: Patient limited by fatigue Patient left: in bed;with call bell/phone within reach;with bed alarm set  OT Visit Diagnosis: Muscle weakness (generalized) (M62.81)                Time: 8366-2947 OT Time Calculation (min): 22 min Charges:  OT General Charges $OT Visit: 1 Visit OT Evaluation $OT Eval Low Complexity: [place], OTR/L Acute Rehabilitation Services [phone] [facility] pager [phone] office 07/07/2018  [place] 07/07/2018, 12:54 PM

## 2018-07-07 NOTE — Progress Notes (Signed)
Physical Therapy Treatment Patient Details Name: [name] MRN: [id number] DOB: Oct 23, 1934 Today's Date: 07/07/2018    History of Present Illness 84 y.o. female with medical history significant of CAD s/p CABG, chronic chest pain, all grafts of CABG except LIMA to LAD are down apparently, A.Fib on eliquis, HTN. Presents to ED with worsening CP with exertion. generalized weakness, nausea and lightheadedness. Currently being treated for systomatic anemia, pulmonary edema and Afib.    PT Comments    Today's skilled session continued to address LE strengthening and gait with RW. Also completed stair instruction today. Pt's BP did drop with activity, however improved when compared to previous sessions. (see below).   Follow Up Recommendations  Home health PT;Supervision/Assistance - 24 hour;SNF     Equipment Recommendations  None recommended by PT    Recommendations for Other Services OT consult     Precautions / Restrictions Precautions Precautions: Fall Precaution Comments: watch BP Restrictions Weight Bearing Restrictions: No    Mobility  Bed Mobility Overal bed mobility: Modified Independent Bed Mobility: Sit to Supine       Sit to supine: Modified independent (Device/Increase time)   General bed mobility comments: bed flat. no rails or assistance needed. increased time required.  Transfers Overall transfer level: Needs assistance Equipment used: Rolling walker (2 wheeled) Transfers: Sit to/from Stand Sit to Stand: Min guard         General transfer comment: x2 reps. min guard for safety and line management only. no physical assistance needed.   Ambulation/Gait Ambulation/Gait assistance: Min guard;Min assist Gait Distance (Feet): 10 Feet(x1, then 8 feet chair to bed) Assistive device: Rolling walker (2 wheeled) Gait Pattern/deviations: Step-through pattern;Decreased stride length;Trunk flexed;Narrow base of support Gait velocity: decreased   General  Gait Details: cues on posture, walker position and step length with gait.    Stairs Stairs: Yes Stairs assistance: Min assist Stair Management: With walker(use of walker to simulate rails) Number of Stairs: 1(x2 reps) General stair comments: cues to ascend with stronger leg and descend with weaker leg. min assist to power up and for balance.  pt reportes she will have her son and spouse to assist her at home.   BP before session with feet up in recliner: 120/49,  HR 74-78, SaO2 >/= 95% RA BP after ex's and stair instruction: 103/47, HR 74-78, SaO2 >/= 95% RA.    Cognition Arousal/Alertness: Awake/alert   Overall Cognitive Status: Within Functional Limits for tasks assessed              Exercises General Exercises - Lower Extremity Ankle Circles/Pumps: AROM;Strengthening;Both;10 reps;Seated(with leg rest up) Quad Sets: AROM;Strengthening;Both;10 reps;Seated(with leg rest up) Long Arc Quad: AROM;Strengthening;Both;10 reps;Seated     Pertinent Vitals/Pain Pain Assessment: 0-10     PT Goals (current goals can now be found in the care plan section) Acute Rehab PT Goals Patient Stated Goal: go home today PT Goal Formulation: With patient Time For Goal Achievement: 07/18/18 Potential to Achieve Goals: Fair Progress towards PT goals: Progressing toward goals    PT Plan Current plan remains appropriate    AM-PAC PT "6 Clicks" Mobility   Outcome Measure  Help needed turning from your back to your side while in a flat bed without using bedrails?: None Help needed moving from lying on your back to sitting on the side of a flat bed without using bedrails?: A Little Help needed moving to and from a bed to a chair (including a wheelchair)?: A Little Help needed standing up  from a chair using your arms (e.g., wheelchair or bedside chair)?: None Help needed to walk in hospital room?: A Little Help needed climbing 3-5 steps with a railing? : A Little 6 Click Score: 20    End of  Session Equipment Utilized During Treatment: Gait belt Activity Tolerance: Patient tolerated treatment well;Patient limited by fatigue Patient left: in bed;with call bell/phone within reach Nurse Communication: Mobility status PT Visit Diagnosis: Unsteadiness on feet (R26.81);Other abnormalities of gait and mobility (R26.89);Repeated falls (R29.6);Muscle weakness (generalized) (M62.81)     Time: 6812-7517 PT Time Calculation (min) (ACUTE ONLY): 23 min  Charges:  $Gait Training: 8-22 mins $Therapeutic Exercise: 8-22 mins                     Willow Ora, PTA, [facility] Acute Rehab Services Office- [phone] 07/07/18, 11:13 AM   Willow Ora 07/07/2018, 11:12 AM

## 2018-07-07 NOTE — Progress Notes (Signed)
Patient maintaining oxygen saturations >90% at all times on room air. Patient ambulated short distance to the chair and was able to maintain adequate oxygen saturations as well.

## 2018-07-07 NOTE — Discharge Summary (Signed)
Physician Discharge Summary   Patient ID: [name] MRN: [id number] DOB/AGE: Jul 09, 1934 84 y.o.  Admit date: 07/02/2018 Discharge date: 07/07/2018  Primary Care Physician:  Leanna Battles, MD   Recommendations for Outpatient Follow-up:  1. Follow up with PCP in 1-2 weeks, if telehealth visit available 2. Please obtain BMP/CBC in one week 3. Patient may restart Eliquis in 3 days, if repeat epistaxis or worsening hemoglobin, patient will need outpatient GI or ENT evaluation.  Home Health: Patient was recommended home health PT OT, RN, home health aide, social work.  Patient declined skilled nursing facility Equipment/Devices: Declined 3n1  Discharge Condition: stable CODE STATUS: FULL Diet recommendation: Heart healthy   Discharge Diagnoses:    . Symptomatic anemia . Angina pectoris ([place]) . Paroxysmal atrial fibrillation ([facility]) . HTN (hypertension) . Pulmonary edema . CAD (coronary artery disease) . Recurrent UTI . Recurrent epistaxis Elevated troponin in the setting of CAD History of chronic UTI  Consults: Cardiology    Allergies:   Allergies  Allergen Reactions  . Demerol Nausea And Vomiting  . Niaspan [Niacin Er] Other (See Comments)    Facial flushing  . Sulfa Antibiotics Other (See Comments)    Causes weakness  . Tape Itching and Other (See Comments)    Burns, too     DISCHARGE MEDICATIONS: Allergies as of 07/07/2018      Reactions   Demerol Nausea And Vomiting   Niaspan [niacin Er] Other (See Comments)   Facial flushing   Sulfa Antibiotics Other (See Comments)   Causes weakness   Tape Itching, Other (See Comments)   Burns, too      Medication List    STOP taking these medications   amLODipine 5 MG tablet Commonly known as:  NORVASC   losartan 100 MG tablet Commonly known as:  COZAAR     TAKE these medications   apixaban 5 MG Tabs tablet Commonly known as:  Eliquis Take 1 tablet (5 mg total) by mouth 2 (two) times daily. Start  taking on:  Jul 10, 2018 What changed:  These instructions start on Jul 10, 2018. If you are unsure what to do until then, ask your doctor or other care provider.   atorvastatin 40 MG tablet Commonly known as:  LIPITOR TAKE 1 TABLET BY MOUTH AT BEDTIME What changed:  when to take this   benzonatate 100 MG capsule Commonly known as:  TESSALON Take 1 capsule (100 mg total) by mouth 3 (three) times daily as needed for cough.   ferrous gluconate 324 MG tablet Commonly known as:  FERGON Take 1 tablet (324 mg total) by mouth daily with breakfast.   Fish Oil 1000 MG Caps Take by mouth daily.   furosemide 20 MG tablet Commonly known as:  Lasix Take 1 tablet (20 mg total) by mouth daily as needed for fluid or edema (shortness of breath, weight gain. please see instructions).   guaiFENesin 600 MG 12 hr tablet Commonly known as:  MUCINEX Take 1 tablet (600 mg total) by mouth 2 (two) times daily.   guaiFENesin-dextromethorphan 100-10 MG/5ML syrup Commonly known as:  ROBITUSSIN DM Take 5 mLs by mouth every 4 (four) hours as needed for cough.   isosorbide mononitrate 60 MG 24 hr tablet Commonly known as:  IMDUR Take 2 tablets (120 mg total) by mouth daily.   metoprolol tartrate 25 MG tablet Commonly known as:  LOPRESSOR Take 1 tablet (25 mg total) by mouth 2 (two) times daily. What changed:    how much  to take  how to take this  when to take this  additional instructions   nitroGLYCERIN 0.4 MG SL tablet Commonly known as:  Nitrostat Place 1 tablet (0.4 mg total) under the tongue every 5 (five) minutes as needed for chest pain.   nortriptyline 10 MG capsule Commonly known as:  PAMELOR Take 20 mg by mouth at bedtime.   omeprazole 40 MG capsule Commonly known as:  PRILOSEC Take 1 capsule (40 mg total) by mouth daily. What changed:    medication strength  how much to take   ranolazine 1000 MG SR tablet Commonly known as:  RANEXA Take 1 tablet (1,000 mg total) by mouth 2  (two) times daily.   senna-docusate 8.6-50 MG tablet Commonly known as:  Senokot S Take 1 tablet by mouth at bedtime as needed for mild constipation or moderate constipation.   trimethoprim 100 MG tablet Commonly known as:  TRIMPEX Take 1 tablet (100 mg total) by mouth daily.        Brief H and P: For complete details please refer to admission H and P, but in brief *Marilyn A Sparksis a 84 y.o.femalewith medical history significant ofCAD s/p CABG, chronic chest pain, all grafts of CABG except LIMA to LAD are down apparently, A.Fib on eliquis, HTN. Patient presents to the ED with worsening CP with exertion for some time recently. Saw Dr. Claiborne Billings for this a couple of days ago in Cards clinic. At that visit metoprolol and Ranexa were increased to help with CP. Patient presented to ED with worsening generalized weakness, nausea, lightheadedness.    Hospital Course:  Symptomatic anemia -Possibly due to recurrent episodes of epistaxis at home however none through the hospitalization Hemoglobin was 6.6 at the time of admission, received 2 unit packed RBCs, improved to 8.1 at the time of discharge. Hemoccult is still pending, anemia panel showed iron deficiency Continue PPI, patient recommended to hold anticoagulation for another 2 days.  She will need outpatient GI referral  Angina with pulmonary edema, elevated troponin in the setting of CAD -Currently stable, no chest pain.  Patient was seen by cardiology. -She received Lasix 40 mg IV for 3 days, subsequently became somewhat orthostatic and volume depleted and received 250 mL bolus of IV fluids -Cardiology follow the patient closely, recommended Lasix as needed with parameters outpatient.   Atrial fibrillation -Continue metoprolol 25 mg twice a day Eliquis currently held due to symptomatic anemia for total 7 days  Hypertension Continue metoprolol  History of chronic UTIs Continue trimethoprim  Day of Discharge S: Feeling  better, wants to go home, no chest pain or shortness of breath.  Has some occasional coughing  BP 124/61 (BP Location: Right Arm)   Pulse 65   Temp 98.1 F (36.7 C) (Oral)   Resp 18   Ht 5\' 1"  (1.549 m)   Wt 56.5 kg   LMP 03/03/1992   SpO2 99%   BMI 23.54 kg/m   Physical Exam: General: Alert and awake oriented x3 not in any acute distress. HEENT: anicteric sclera, pupils reactive to light and accommodation CVS: S1-S2 clear no murmur rubs or gallops Chest: clear to auscultation bilaterally, no wheezing rales or rhonchi Abdomen: soft nontender, nondistended, normal bowel sounds Extremities: no cyanosis, clubbing or edema noted bilaterally Neuro: Cranial nerves II-XII intact, no focal neurological deficits   The results of significant diagnostics from this hospitalization (including imaging, microbiology, ancillary and laboratory) are listed below for reference.      Procedures/Studies:  Dg Chest  2 View  Result Date: 07/03/2018 CLINICAL DATA:  Chest pain, shortness of breath EXAM: CHEST - 2 VIEW COMPARISON:  None. FINDINGS: Prior median sternotomy and CABG. Low lung volumes. Mild vascular congestion. Bibasilar opacities with small effusions. No acute bony abnormality. IMPRESSION: Low lung volumes with bibasilar opacities, left greater than right, atelectasis versus infiltrates. Small bilateral effusions. Mild vascular congestion. Electronically Signed   By: [name] M.D.   On: 07/03/2018 01:02   Dg Chest Port 1 View  Result Date: 07/06/2018 CLINICAL DATA:  Cough.  Edema EXAM: PORTABLE CHEST 1 VIEW COMPARISON:  Two days ago FINDINGS: Cardiomegaly. There has been CABG. Asymmetric lung opacity with mild improvement in aeration at the left base where the diaphragm is becoming more apparent. No effusion or pneumothorax. IMPRESSION: Mild improvement in aeration.  No new abnormality. Electronically Signed   By: Monte Fantasia M.D.   On: 07/06/2018 08:33   Dg Chest Port 1 View  Result  Date: 07/04/2018 CLINICAL DATA:  Edema. EXAM: PORTABLE CHEST 1 VIEW COMPARISON:  Chest x-ray from yesterday. FINDINGS: Stable cardiomediastinal silhouette status post CABG. Unchanged pulmonary vascular congestion and mild interstitial pulmonary edema. Persistent left basilar airspace disease. Unchanged small bilateral pleural effusions. No pneumothorax. No acute osseous abnormality. IMPRESSION: 1. Unchanged mild pulmonary interstitial edema and small bilateral pleural effusions. 2. Persistent left lower lobe atelectasis versus infiltrate. Electronically Signed   By: Titus Dubin M.D.   On: 07/04/2018 07:51       LAB RESULTS: Basic Metabolic Panel: Recent Labs  Lab 07/03/18 0015 07/07/18 0647  NA 127* 132*  K 3.9 3.8  CL 97* 99  CO2 20* 26  GLUCOSE 125* 103*  BUN 11 12  CREATININE 0.83 0.72  CALCIUM 8.6* 8.9   Liver Function Tests: Recent Labs  Lab 07/03/18 0015  AST 32  ALT 19  ALKPHOS 63  BILITOT 1.0  PROT 6.8  ALBUMIN 3.0*   No results for input(s): LIPASE, AMYLASE in the last 168 hours. No results for input(s): AMMONIA in the last 168 hours. CBC: Recent Labs  Lab 07/03/18 0015  07/06/18 0341 07/07/18 0647  WBC 9.1  --   --  7.6  HGB 6.6*   < > 8.8* 8.1*  HCT 21.0*   < > 27.0* 25.6*  MCV 94.2  --   --  89.8  PLT 124*  --   --  127*   < > = values in this interval not displayed.   Cardiac Enzymes: Recent Labs  Lab 07/03/18 0752 07/03/18 1404  TROPONINI 0.21* 0.21*   BNP: Invalid input(s): POCBNP CBG: Recent Labs  Lab 07/03/18 0015  GLUCAP 127*      Disposition and Follow-up: Discharge Instructions    (HEART FAILURE PATIENTS) Call MD:  Anytime you have any of the following symptoms: 1) 3 pound weight gain in 24 hours or 5 pounds in 1 week 2) shortness of breath, with or without a dry hacking cough 3) swelling in the hands, feet or stomach 4) if you have to sleep on extra pillows at night in order to breathe.   Complete by:  As directed    Diet -  low sodium heart healthy   Complete by:  As directed    Increase activity slowly   Complete by:  As directed        DISPOSITION: [facility]    Almyra Deforest, PA Follow up on 07/16/2018.   Specialties:  Cardiology, Radiology Why:  @  10am for virtual office visit  Contact information: [address]  [place] [address] [place], [place] Follow up.   Specialty:  Home Health Services Why:  Registred Nurse, Physical & Occupational Therapies. Aide, Education officer, museum.  Contact information: [place] [address] [phone]        Leanna Battles, MD. Schedule an appointment as soon as possible for a visit in 1 week(s).   Specialty:  Internal Medicine Why:  need referral for GI  Contact information: [place] [address] [phone]        Troy Sine, MD .   Specialty:  Cardiology Contact information: [address] [place] [address] [phone]            Time coordinating discharge:  35 minutes  Signed:   Estill Cotta M.D. [facility] 07/07/2018, 2:32 PM

## 2018-07-09 DIAGNOSIS — L89150 Pressure ulcer of sacral region, unstageable: Secondary | ICD-10-CM | POA: Diagnosis not present

## 2018-07-09 DIAGNOSIS — Z87440 Personal history of urinary (tract) infections: Secondary | ICD-10-CM | POA: Diagnosis not present

## 2018-07-09 DIAGNOSIS — I214 Non-ST elevation (NSTEMI) myocardial infarction: Secondary | ICD-10-CM | POA: Diagnosis not present

## 2018-07-09 DIAGNOSIS — K219 Gastro-esophageal reflux disease without esophagitis: Secondary | ICD-10-CM | POA: Diagnosis not present

## 2018-07-09 DIAGNOSIS — F419 Anxiety disorder, unspecified: Secondary | ICD-10-CM | POA: Diagnosis not present

## 2018-07-09 DIAGNOSIS — Z993 Dependence on wheelchair: Secondary | ICD-10-CM | POA: Diagnosis not present

## 2018-07-09 DIAGNOSIS — E7800 Pure hypercholesterolemia, unspecified: Secondary | ICD-10-CM | POA: Diagnosis not present

## 2018-07-09 DIAGNOSIS — I509 Heart failure, unspecified: Secondary | ICD-10-CM | POA: Diagnosis not present

## 2018-07-09 DIAGNOSIS — D500 Iron deficiency anemia secondary to blood loss (chronic): Secondary | ICD-10-CM | POA: Diagnosis not present

## 2018-07-09 DIAGNOSIS — I110 Hypertensive heart disease with heart failure: Secondary | ICD-10-CM | POA: Diagnosis not present

## 2018-07-09 DIAGNOSIS — Z7901 Long term (current) use of anticoagulants: Secondary | ICD-10-CM | POA: Diagnosis not present

## 2018-07-09 DIAGNOSIS — Z9181 History of falling: Secondary | ICD-10-CM | POA: Diagnosis not present

## 2018-07-09 DIAGNOSIS — R159 Full incontinence of feces: Secondary | ICD-10-CM | POA: Diagnosis not present

## 2018-07-09 DIAGNOSIS — M1990 Unspecified osteoarthritis, unspecified site: Secondary | ICD-10-CM | POA: Diagnosis not present

## 2018-07-09 DIAGNOSIS — I4891 Unspecified atrial fibrillation: Secondary | ICD-10-CM | POA: Diagnosis not present

## 2018-07-09 DIAGNOSIS — I2510 Atherosclerotic heart disease of native coronary artery without angina pectoris: Secondary | ICD-10-CM | POA: Diagnosis not present

## 2018-07-09 DIAGNOSIS — Z951 Presence of aortocoronary bypass graft: Secondary | ICD-10-CM | POA: Diagnosis not present

## 2018-07-09 DIAGNOSIS — R32 Unspecified urinary incontinence: Secondary | ICD-10-CM | POA: Diagnosis not present

## 2018-07-10 DIAGNOSIS — I469 Cardiac arrest, cause unspecified: Secondary | ICD-10-CM | POA: Diagnosis not present

## 2018-07-10 DIAGNOSIS — R404 Transient alteration of awareness: Secondary | ICD-10-CM | POA: Diagnosis not present

## 2018-07-16 ENCOUNTER — Telehealth: Payer: Medicare Other | Admitting: Physician Assistant

## 2018-08-02 DIAGNOSIS — 419620001 Death: Secondary | SNOMED CT | POA: Diagnosis not present

## 2018-08-02 DEATH — deceased

## 2018-08-13 ENCOUNTER — Ambulatory Visit: Payer: Medicare Other | Admitting: Cardiology

## 2018-08-31 ENCOUNTER — Ambulatory Visit: Payer: Medicare Other | Admitting: Cardiology

## 2020-03-26 IMAGING — DX PORTABLE CHEST - 1 VIEW
1 series · 1 of 1 positions shown · non-contrast
Comparison: Chest x-ray from yesterday.

CLINICAL DATA: Edema.

EXAM:
PORTABLE CHEST 1 VIEW

[chest]
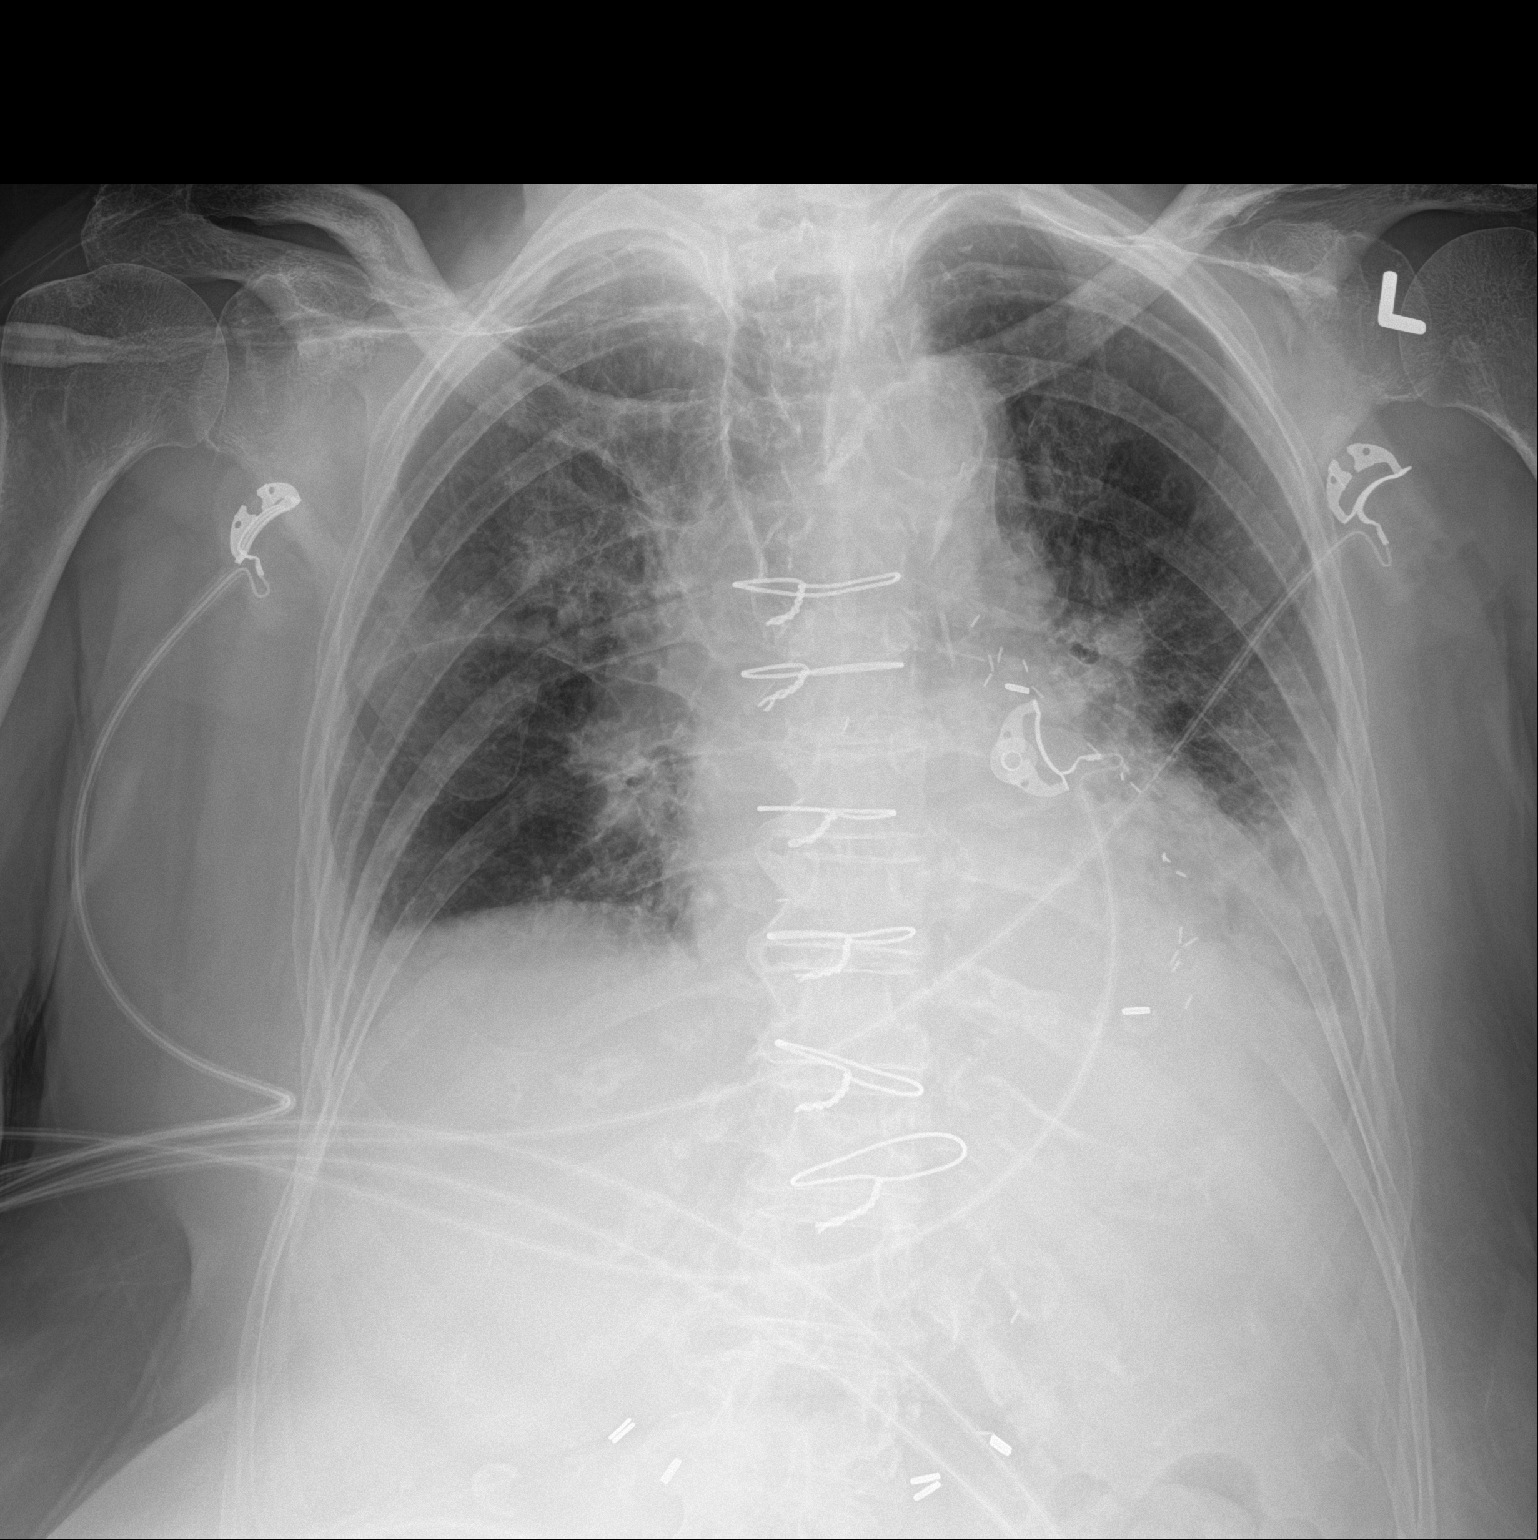

[1 of 1 positions shown; findings below may reference images not displayed]

FINDINGS: Stable cardiomediastinal silhouette status post CABG. Unchanged
pulmonary vascular congestion and mild interstitial pulmonary edema.
Persistent left basilar airspace disease. Unchanged small bilateral
pleural effusions. No pneumothorax. No acute osseous abnormality.
IMPRESSION: 1. Unchanged mild pulmonary interstitial edema and small bilateral
pleural effusions.
2. Persistent left lower lobe atelectasis versus infiltrate.

## 2020-03-28 IMAGING — DX PORTABLE CHEST - 1 VIEW
1 series · 1 of 1 positions shown · non-contrast
Comparison: Two days ago

CLINICAL DATA: Cough.  Edema

EXAM:
PORTABLE CHEST 1 VIEW

[chest ap]
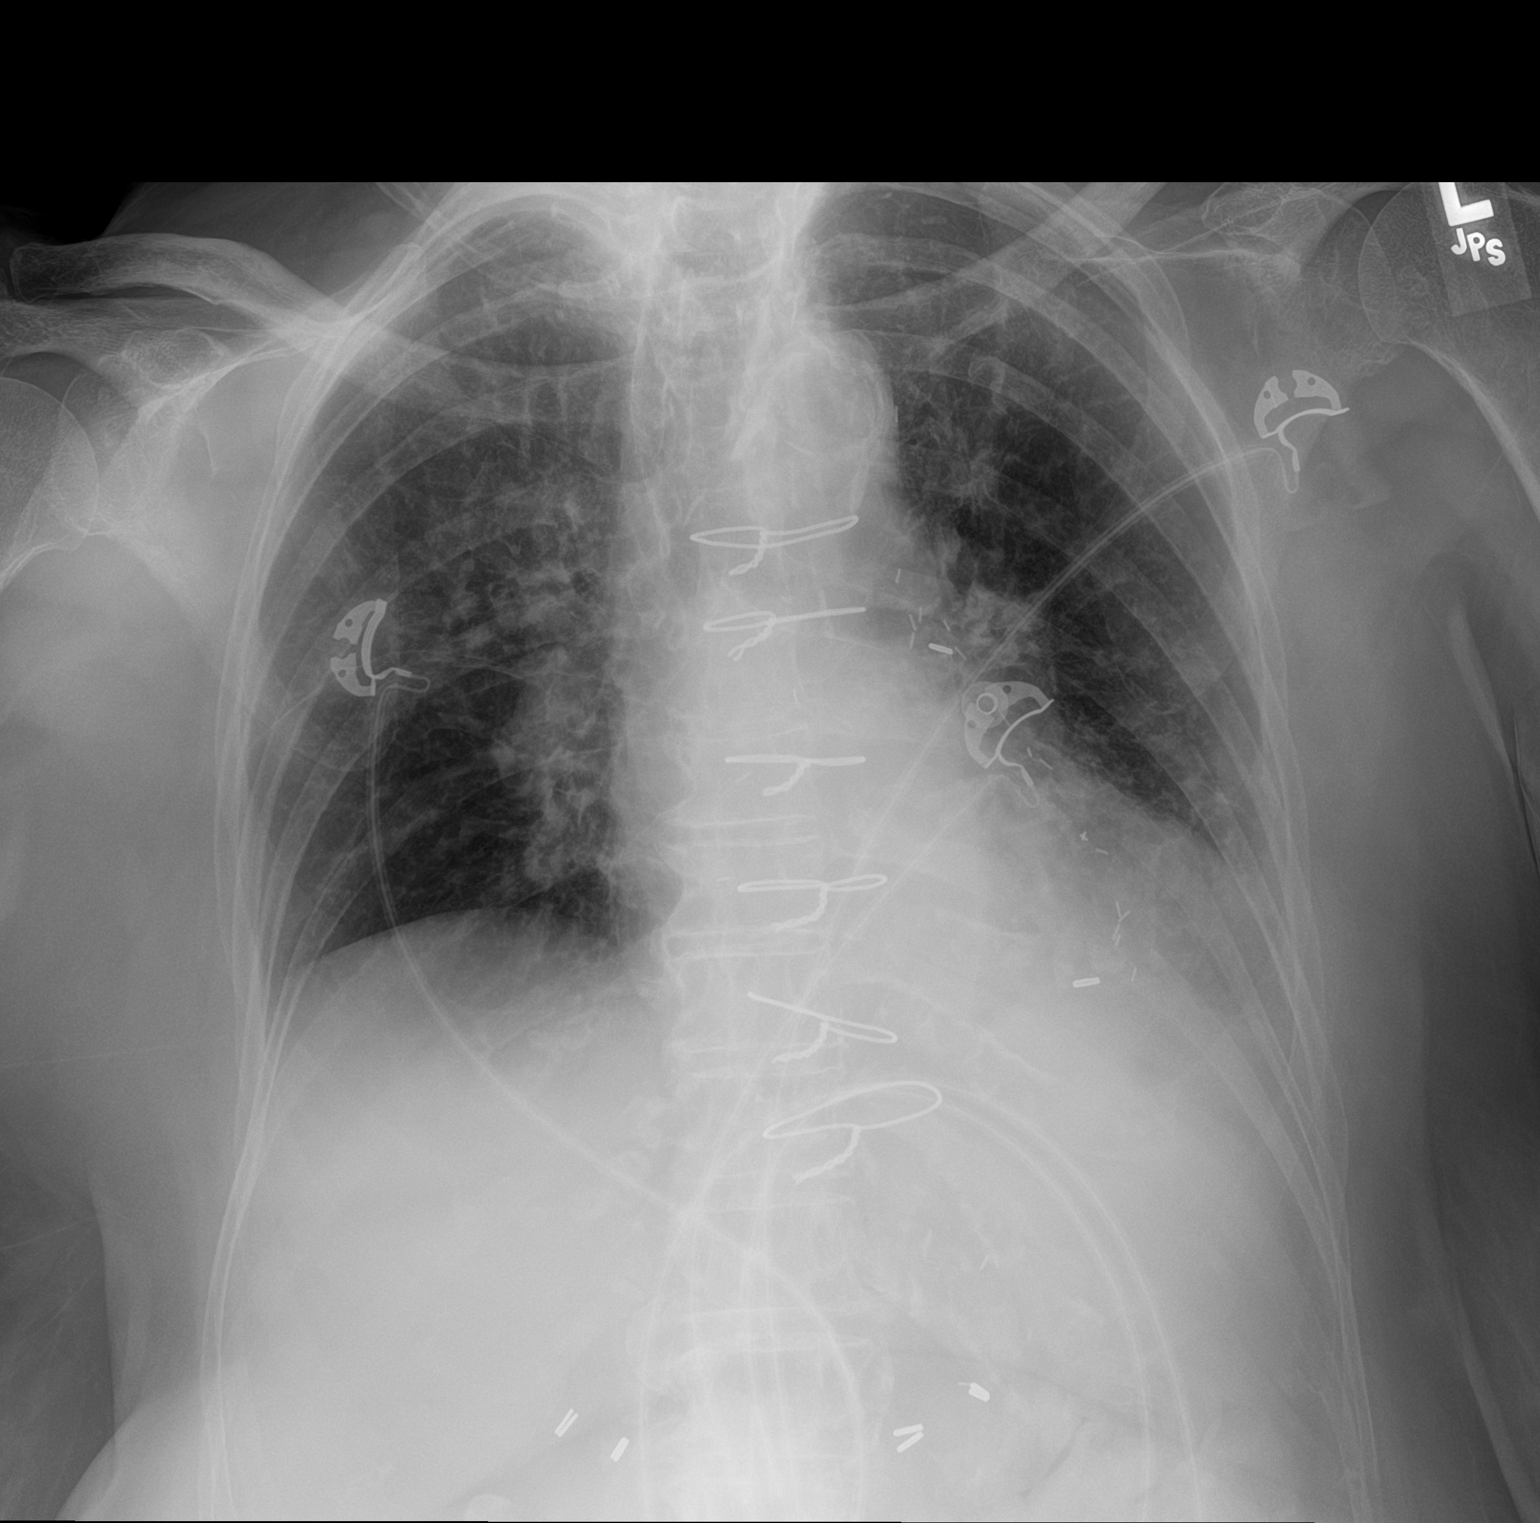

[1 of 1 positions shown; findings below may reference images not displayed]

FINDINGS: Cardiomegaly. There has been CABG. Asymmetric lung opacity with mild
improvement in aeration at the left base where the diaphragm is
becoming more apparent. No effusion or pneumothorax.
IMPRESSION: Mild improvement in aeration.  No new abnormality.
